# Patient Record
Sex: Female | Born: 1975 | Race: White | Hispanic: No | Marital: Married | Smoking: Current every day smoker
Health system: Southern US, Community
[De-identification: ages and names within clinical notes are randomized; demographics above are authoritative.]

## PROBLEM LIST (undated history)

## (undated) DIAGNOSIS — T4145XA Adverse effect of unspecified anesthetic, initial encounter: Secondary | ICD-10-CM

## (undated) DIAGNOSIS — Z85850 Personal history of malignant neoplasm of thyroid: Secondary | ICD-10-CM

## (undated) DIAGNOSIS — R3915 Urgency of urination: Secondary | ICD-10-CM

## (undated) DIAGNOSIS — K5900 Constipation, unspecified: Secondary | ICD-10-CM

## (undated) DIAGNOSIS — G4730 Sleep apnea, unspecified: Secondary | ICD-10-CM

## (undated) DIAGNOSIS — R918 Other nonspecific abnormal finding of lung field: Secondary | ICD-10-CM

## (undated) DIAGNOSIS — K219 Gastro-esophageal reflux disease without esophagitis: Secondary | ICD-10-CM

## (undated) DIAGNOSIS — M549 Dorsalgia, unspecified: Secondary | ICD-10-CM

## (undated) DIAGNOSIS — M542 Cervicalgia: Secondary | ICD-10-CM

## (undated) DIAGNOSIS — C801 Malignant (primary) neoplasm, unspecified: Secondary | ICD-10-CM

## (undated) DIAGNOSIS — R3989 Other symptoms and signs involving the genitourinary system: Secondary | ICD-10-CM

## (undated) DIAGNOSIS — Z923 Personal history of irradiation: Secondary | ICD-10-CM

## (undated) DIAGNOSIS — E785 Hyperlipidemia, unspecified: Secondary | ICD-10-CM

## (undated) DIAGNOSIS — M797 Fibromyalgia: Secondary | ICD-10-CM

## (undated) DIAGNOSIS — T8859XA Other complications of anesthesia, initial encounter: Secondary | ICD-10-CM

## (undated) DIAGNOSIS — R350 Frequency of micturition: Secondary | ICD-10-CM

## (undated) DIAGNOSIS — F419 Anxiety disorder, unspecified: Secondary | ICD-10-CM

## (undated) DIAGNOSIS — K589 Irritable bowel syndrome without diarrhea: Secondary | ICD-10-CM

## (undated) DIAGNOSIS — E0789 Other specified disorders of thyroid: Secondary | ICD-10-CM

## (undated) DIAGNOSIS — Z90710 Acquired absence of both cervix and uterus: Secondary | ICD-10-CM

## (undated) DIAGNOSIS — E890 Postprocedural hypothyroidism: Secondary | ICD-10-CM

## (undated) DIAGNOSIS — K921 Melena: Secondary | ICD-10-CM

## (undated) DIAGNOSIS — G43909 Migraine, unspecified, not intractable, without status migrainosus: Secondary | ICD-10-CM

## (undated) DIAGNOSIS — E669 Obesity, unspecified: Secondary | ICD-10-CM

## (undated) HISTORY — PX: OTHER SURGICAL HISTORY: SHX169

## (undated) HISTORY — PX: COLONOSCOPY: SHX174

## (undated) HISTORY — PX: TUBAL LIGATION: SHX77

## (undated) HISTORY — DX: Other specified disorders of thyroid: E07.89

## (undated) HISTORY — DX: Constipation, unspecified: K59.00

## (undated) HISTORY — DX: Irritable bowel syndrome, unspecified: K58.9

## (undated) HISTORY — DX: Dorsalgia, unspecified: M54.9

## (undated) HISTORY — DX: Obesity, unspecified: E66.9

## (undated) HISTORY — DX: Migraine, unspecified, not intractable, without status migrainosus: G43.909

## (undated) HISTORY — DX: Cervicalgia: M54.2

## (undated) HISTORY — PX: FOOT SURGERY: SHX648

---

## 1998-08-01 ENCOUNTER — Other Ambulatory Visit: Admission: RE | Admit: 1998-08-01 | Discharge: 1998-08-01 | Payer: Self-pay | Admitting: Obstetrics and Gynecology

## 1999-02-01 ENCOUNTER — Other Ambulatory Visit: Admission: RE | Admit: 1999-02-01 | Discharge: 1999-02-01 | Payer: Self-pay | Admitting: Obstetrics and Gynecology

## 1999-02-01 ENCOUNTER — Encounter (INDEPENDENT_AMBULATORY_CARE_PROVIDER_SITE_OTHER): Payer: Self-pay

## 1999-06-15 ENCOUNTER — Other Ambulatory Visit: Admission: RE | Admit: 1999-06-15 | Discharge: 1999-06-15 | Payer: Self-pay | Admitting: Emergency Medicine

## 2000-02-07 ENCOUNTER — Other Ambulatory Visit: Admission: RE | Admit: 2000-02-07 | Discharge: 2000-02-07 | Payer: Self-pay | Admitting: *Deleted

## 2000-11-06 ENCOUNTER — Other Ambulatory Visit: Admission: RE | Admit: 2000-11-06 | Discharge: 2000-11-06 | Payer: Self-pay | Admitting: *Deleted

## 2001-08-03 ENCOUNTER — Other Ambulatory Visit: Admission: RE | Admit: 2001-08-03 | Discharge: 2001-08-03 | Payer: Self-pay | Admitting: Obstetrics and Gynecology

## 2001-10-26 ENCOUNTER — Other Ambulatory Visit: Admission: RE | Admit: 2001-10-26 | Discharge: 2001-10-26 | Payer: Self-pay | Admitting: Obstetrics and Gynecology

## 2002-12-02 ENCOUNTER — Other Ambulatory Visit: Admission: RE | Admit: 2002-12-02 | Discharge: 2002-12-02 | Payer: Self-pay | Admitting: Obstetrics and Gynecology

## 2005-07-29 ENCOUNTER — Inpatient Hospital Stay (HOSPITAL_COMMUNITY): Admission: AD | Admit: 2005-07-29 | Discharge: 2005-07-29 | Payer: Self-pay | Admitting: Obstetrics and Gynecology

## 2005-08-09 ENCOUNTER — Inpatient Hospital Stay (HOSPITAL_COMMUNITY): Admission: RE | Admit: 2005-08-09 | Discharge: 2005-08-12 | Payer: Self-pay | Admitting: Obstetrics and Gynecology

## 2007-03-09 ENCOUNTER — Encounter: Admission: RE | Admit: 2007-03-09 | Discharge: 2007-03-09 | Payer: Self-pay | Admitting: Obstetrics & Gynecology

## 2007-03-09 ENCOUNTER — Ambulatory Visit: Payer: Self-pay | Admitting: Obstetrics & Gynecology

## 2007-03-10 ENCOUNTER — Ambulatory Visit: Payer: Self-pay | Admitting: Obstetrics and Gynecology

## 2007-03-17 ENCOUNTER — Ambulatory Visit: Payer: Self-pay | Admitting: Physician Assistant

## 2007-03-17 ENCOUNTER — Encounter: Payer: Self-pay | Admitting: Physician Assistant

## 2007-04-21 ENCOUNTER — Ambulatory Visit: Payer: Self-pay | Admitting: Obstetrics and Gynecology

## 2007-04-22 ENCOUNTER — Ambulatory Visit: Payer: Self-pay | Admitting: Obstetrics & Gynecology

## 2007-10-21 ENCOUNTER — Inpatient Hospital Stay (HOSPITAL_COMMUNITY): Admission: AD | Admit: 2007-10-21 | Discharge: 2007-10-24 | Payer: Self-pay | Admitting: Obstetrics and Gynecology

## 2007-10-21 ENCOUNTER — Encounter (INDEPENDENT_AMBULATORY_CARE_PROVIDER_SITE_OTHER): Payer: Self-pay | Admitting: Obstetrics and Gynecology

## 2008-03-02 ENCOUNTER — Encounter (INDEPENDENT_AMBULATORY_CARE_PROVIDER_SITE_OTHER): Payer: Self-pay | Admitting: *Deleted

## 2008-03-02 ENCOUNTER — Ambulatory Visit (HOSPITAL_COMMUNITY): Admission: RE | Admit: 2008-03-02 | Discharge: 2008-03-02 | Payer: Self-pay | Admitting: *Deleted

## 2008-03-02 HISTORY — PX: HEMORRHOID SURGERY: SHX153

## 2010-04-06 ENCOUNTER — Encounter: Payer: Self-pay | Admitting: Emergency Medicine

## 2010-04-11 ENCOUNTER — Ambulatory Visit: Payer: Self-pay | Admitting: Emergency Medicine

## 2010-04-11 DIAGNOSIS — J984 Other disorders of lung: Secondary | ICD-10-CM | POA: Insufficient documentation

## 2010-04-11 DIAGNOSIS — G43909 Migraine, unspecified, not intractable, without status migrainosus: Secondary | ICD-10-CM | POA: Insufficient documentation

## 2010-04-11 DIAGNOSIS — F17200 Nicotine dependence, unspecified, uncomplicated: Secondary | ICD-10-CM | POA: Insufficient documentation

## 2010-04-12 ENCOUNTER — Ambulatory Visit: Payer: Self-pay | Admitting: Cardiology

## 2010-04-15 HISTORY — PX: OTHER SURGICAL HISTORY: SHX169

## 2010-04-17 ENCOUNTER — Telehealth (INDEPENDENT_AMBULATORY_CARE_PROVIDER_SITE_OTHER): Payer: Self-pay | Admitting: *Deleted

## 2010-05-01 ENCOUNTER — Telehealth: Payer: Self-pay | Admitting: Emergency Medicine

## 2010-05-03 ENCOUNTER — Telehealth (INDEPENDENT_AMBULATORY_CARE_PROVIDER_SITE_OTHER): Payer: Self-pay | Admitting: *Deleted

## 2010-05-08 ENCOUNTER — Ambulatory Visit
Admission: RE | Admit: 2010-05-08 | Discharge: 2010-05-08 | Payer: Self-pay | Source: Home / Self Care | Attending: Emergency Medicine | Admitting: Emergency Medicine

## 2010-05-08 ENCOUNTER — Other Ambulatory Visit: Payer: Self-pay | Admitting: Emergency Medicine

## 2010-05-08 DIAGNOSIS — R911 Solitary pulmonary nodule: Secondary | ICD-10-CM

## 2010-05-17 NOTE — Progress Notes (Signed)
Summary: appt set op review CT  Phone Note Call from Patient Call back at Home Phone [phone]   Caller: Patient Call For: Cathi Hazan Summary of Call: Pt states she has just been diagnosed with thyroid cancer and wants to know if she should come in sooner than 6 months for a repeat ct for the nodules on her right lung since she has cancer in her body pls advise. Initial call taken by: Darletta Moll,  May 01, 2010 9:47 AM  Follow-up for Phone Call        Per last phone note pt was given results of CT but advised to set up appt to discuss next step. Pt now states she was diagnosed with thyroid cancer and wanted to know if this changed the plan for repeat CT in 6 months. I advised pt that she needs to set an appt to discuss this with Dr. Delton Coombes. Appt set for 05-15-10. Carron Curie CMA  May 01, 2010 12:14 PM

## 2010-05-17 NOTE — Progress Notes (Signed)
Summary: RESULTS OF CT  Phone Note Call from Patient Call back at Home Phone [phone]   Caller: Patient Call For: BYRUM Summary of Call: WANTS RESULTS OF CT SCAN Initial call taken by: Lacinda Axon,  April 17, 2010 3:16 PM  Follow-up for Phone Call        Called and spoke with pt and notified that RB is out of the office until 04/23/09 and advised that we will forward him this msg and he will address when he returns.  Pt verbalized understanding of this. Pls advise CT Chest results, thanks! Follow-up by: Vernie Murders,  April 17, 2010 4:58 PM  Additional Follow-up for Phone Call Additional follow up Details #1::        Please notify pt that her CT chest shows her small pulmonary nodules that were first detected on her CT abd/pelvis. There are 4 small nodules on the R, 1 small nodule on the L. We will need to repeat a CT scan of the chest without contrast in 6 months to insure that these are not changing. We will need to follow up after the CT scan to review the results and to decide on next steps.  For now I'd like her to set up an OV next available to discuss tobacco cessation and getting PFT's. Leslye Peer MD  April 24, 2010 9:06 PM  Additional Follow-up by: Leslye Peer MD,  April 24, 2010 9:06 PM    Additional Follow-up for Phone Call Additional follow up Details #2::    Spoke with pt and notified of the above results/recs per RB.  Pt verbalized understanding.  She states that this is not a good time for her to sched appt, and will call back to sched appt when she is able. Follow-up by: Vernie Murders,  April 25, 2010 8:59 AM

## 2010-05-17 NOTE — Progress Notes (Signed)
Summary: thyroid surgery 1/31 needs to be seen prior/ pt returned call  Phone Note Call from Patient   Caller: Patient Call For: DR [facility] Summary of Call: Patient phoned she has thyroid cancer and is scheduled for Thyroid surgery on 05/15/10 with Dr. Moshe Cipro. Per patient Dr. Moshe Cipro wants pt to be seen by Dr. Delton Coombes prior to the surgery because she has nodules in her lungs and he wants to know if Dr. Delton Coombes wants to biopsy prior to his surgery. Patient has canceled her appointment for 05/15/10 due to surgery that day and wants to be seen sooner.  Patient can be reached [phone] Initial call taken by: Vedia Coffer,  May 03, 2010 10:34 AM  Follow-up for Phone Call        [facility]  Can schedule pt for Tues., 1/24 @ 9am w/ RB.Michel Bickers [facility]  May 03, 2010 11:47 AM patient returned call from triage she can be reached at [phone]. Vedia Coffer  May 03, 2010 11:52 AM  Pt returned call from triage. Tivis Ringer, CNA  May 03, 2010 1:47 PM  Additional Follow-up for Phone Call Additional follow up Details #1::        Spoke with pt and this date is fine for her so sched appt with RB for Tues, 05/08/10 at 9 am. Additional Follow-up by: Vernie Murders,  May 03, 2010 2:01 PM

## 2010-05-17 NOTE — Assessment & Plan Note (Signed)
Summary: pulm nodules   Visit Type:  Follow-up Copy to:  Dr. Candie Echevaria Primary Provider/Referring Provider:  Dr. Candie Echevaria  CC:  Pulmonary nodule follow-up...new diagnosis of thyroid cancer, followed by Dr. Georjean Mode (ENT), and endocrinologist is Dr. Warden Fillers.  History of Present Illness: 35 yo woman, smoker w hx migraines. Referred for abnormal CT scan of abd and pelvis at [facility] 04/06/10 -- showed 3 small (<46mm) RLL nodules. She hasn't had CT chest yet. No other complaints except as below, "smoker's cough", some exertional dyspnea.   ROV 05/07/10 -- follow up today for small B LL nodules seen on abd CT, now confirmed on chest CT scan performed on 04/12/10. 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 Planning for surgery next week      Preventive Screening-Counseling & Management  Alcohol-Tobacco     Smoking Status: current     Packs/Day: 0.5  Current Medications (verified): 1)  Cyclobenzaprine Hcl 10 Mg Tabs (Cyclobenzaprine Hcl) .Marland Kitchen.. 1-2 By Mouth Daily As Needed 2)  Advil 200 Mg Tabs (Ibuprofen) .... As Needed 3)  Chantix 1 Mg Tabs (Varenicline Tartrate) .Marland Kitchen.. 1 By Mouth Two Times A Day 4)  Lipitor 20 Mg Tabs (Atorvastatin Calcium) .Marland Kitchen.. 1 By Mouth Daily 5)  Ambien 10 Mg Tabs (Zolpidem Tartrate) .Marland Kitchen.. 1 By Mouth At Bedtime As Needed 6)  Tramadol Hcl 50 Mg Tabs (Tramadol Hcl) .Marland Kitchen.. 1 By Mouth Rpn  Allergies (verified): 1)  ! Pcn 2)  ! Codeine  Social History: Packs/Day:  0.5  Vital Signs:  Patient profile:   35 year old female Height:      63 inches (160.02 cm) Weight:      219 pounds (99.55 kg) BMI:     38.93 O2 Sat:      96 % on Room air Temp:     98.8 degrees F (37.11 degrees C) oral Pulse rate:   73 / minute BP sitting:   112 / 80  (left arm) Cuff size:   large  Vitals Entered By: Michel Bickers CMA (May 08, 2010 8:54 AM)  O2 Sat at Rest %:  96 O2 Flow:  Room air CC: Pulmonary nodule follow-up...new diagnosis of thyroid cancer,  followed by Dr. Georjean Mode (ENT), endocrinologist is Dr. Warden Fillers Is Patient Diabetic? No Comments Medications reviewed with patient Michel Bickers CMA  May 08, 2010 9:02 AM   Physical Exam  General:  well developed, well nourished, in no acute distress, obese.   Head:  normocephalic and atraumatic Eyes:  conjunctiva and sclera clear Nose:  no deformity, discharge, inflammation, or lesions Mouth:  no deformity or lesions Lungs:  clear bilaterally to auscultation and percussion Heart:  regular rate and rhythm, S1, S2 without murmurs, rubs, gallops, or clicks Abdomen:  not examined Msk:  no deformity or scoliosis noted with normal posture Extremities:  no clubbing, cyanosis, edema, or deformity noted Neurologic:  non-focal Skin:  intact without lesions or rashes Psych:  alert and cooperative; normal mood and affect; normal attention span and concentration   Impression & Recommendations:  Problem # 1:  PULMONARY NODULE (ICD-518.89) Small nodules, not amenable to bx based on location (ast least at this time)  - follow Ct scan 3 mo - discuss Ct at [facility] to get other opinions about options.   Radiology Referral (Radiology) Est. Patient Level IV (16109)  Medications Added to Medication List This Visit: 1)  Chantix 1 Mg Tabs (Varenicline tartrate) .Marland Kitchen.. 1 by mouth two times  a day 2)  Lipitor 20 Mg Tabs (Atorvastatin calcium) .Marland Kitchen.. 1 by mouth daily 3)  Ambien 10 Mg Tabs (Zolpidem tartrate) .Marland Kitchen.. 1 by mouth at bedtime as needed 4)  Tramadol Hcl 50 Mg Tabs (Tramadol hcl) .Marland Kitchen.. 1 by mouth rpn  Patient Instructions: 1)  We will review your CT scan at Thoracic conference 2)  We will plan to repeat your CT scan with contrast in March 2012 to see if there has been any change in your small pulmonary nodules.  3)  Get your thyroid procedure as planned 4)  Follow up with Dr Delton Coombes in late March after your CT scan.    Immunization History:  Influenza Immunization History:    Influenza:   historical (04/12/2010)

## 2010-05-17 NOTE — Assessment & Plan Note (Signed)
Summary: pulm nodules, tobacco   Visit Type:  Initial Consult Copy to:  Dr. Candie Echevaria Primary Venancio Chenier/Referring Shamiah Kahler:  Dr. Candie Echevaria  CC:  Pulmonary consult for lung nodule.  History of Present Illness: 35 yo woman, smoker w hx migraines. Referred for abnormal CT scan of abd and pelvis at [facility] 04/06/10 -- showed 3 small (<84mm) RLL nodules. She hasn't had CT chest yet. No other complaints except as below, "smoker's cough", some exertional dyspnea.    Best phone # [phone]     Preventive Screening-Counseling & Management  Alcohol-Tobacco     Smoking Status: current     Packs/Day: 1.0     Pack years: 25  Current Medications (verified): 1)  Cyclobenzaprine Hcl 10 Mg Tabs (Cyclobenzaprine Hcl) .Marland Kitchen.. 1-2 By Mouth Daily As Needed 2)  Advil 200 Mg Tabs (Ibuprofen) .... As Needed  Allergies (verified): 1)  ! Pcn 2)  ! Codeine  Past History:  Past Medical History:  TOBACCO ABUSE (ICD-305.1) MIGRAINE HEADACHE (ICD-346.90)  Past Surgical History: Hemorrhoidectomy in 2009 C-section x2, 2007 & 2009  Family History: Family History Breast Cancer---mother Family History Colon Cancer---MGM Family History Asthma---brother CABG---father  Social History: Patient is a current smoker.  married tax consultantSmoking Status:  current Packs/Day:  1.0 Pack years:  25  Review of Systems       The patient complains of shortness of breath with activity, productive cough, chest pain, acid heartburn, indigestion, abdominal pain, and headaches.  The patient denies shortness of breath at rest, non-productive cough, coughing up blood, irregular heartbeats, loss of appetite, weight change, difficulty swallowing, sore throat, tooth/dental problems, nasal congestion/difficulty breathing through nose, sneezing, itching, ear ache, anxiety, depression, hand/feet swelling, joint stiffness or pain, rash, change in color of mucus, and fever.    Vital Signs:  Patient profile:   35 year  old female Height:      63 inches (160.02 cm) Weight:      218 pounds (99.09 kg) BMI:     38.76 O2 Sat:      97 % on Room air Temp:     98.1 degrees F (36.72 degrees C) oral Pulse rate:   92 / minute BP sitting:   122 / 82  (left arm) Cuff size:   large  Vitals Entered By: Michel Bickers CMA (April 11, 2010 2:14 PM)  O2 Sat at Rest %:  97 O2 Flow:  Room air  Physical Exam  General:  well developed, well nourished, in no acute distress, obese.   Head:  normocephalic and atraumatic Eyes:  conjunctiva and sclera clear Nose:  no deformity, discharge, inflammation, or lesions Mouth:  no deformity or lesions Lungs:  clear bilaterally to auscultation and percussion Heart:  regular rate and rhythm, S1, S2 without murmurs, rubs, gallops, or clicks Abdomen:  not examined Msk:  no deformity or scoliosis noted with normal posture Extremities:  no clubbing, cyanosis, edema, or deformity noted Neurologic:  non-focal Skin:  intact without lesions or rashes Psych:  alert and cooperative; normal mood and affect; normal attention span and concentration   Impression & Recommendations:  Problem # 1:  PULMONARY NODULE (ICD-518.89) 3 small RLL nodules in a smoker, asymptomatic. Will get dedicated CT chest, make f/u plans depending on whethe we see additional nodules I will call her w results, best phone # is above Orders: Consultation Level IV (30865) Radiology Referral (Radiology)  Problem # 2:  TOBACCO ABUSE (ICD-305.1) Will need PFt, to discuss cessation in the future  Medications  Added to Medication List This Visit: 1)  Cyclobenzaprine Hcl 10 Mg Tabs (Cyclobenzaprine hcl) .Marland Kitchen.. 1-2 by mouth daily as needed 2)  Advil 200 Mg Tabs (Ibuprofen) .... As needed  Patient Instructions: 1)  We will perform a CT scan of the chest to better evaluate your pulmonary nodules.  2)  Dr Delton Coombes will call you to review the results.  3)  We will plan for follow up when we speak on the phone.

## 2010-06-29 ENCOUNTER — Emergency Department (HOSPITAL_BASED_OUTPATIENT_CLINIC_OR_DEPARTMENT_OTHER)
Admission: EM | Admit: 2010-06-29 | Discharge: 2010-06-29 | Disposition: A | Discharge status: Home / Self Care | Payer: 59 | Attending: Emergency Medicine | Admitting: Emergency Medicine

## 2010-06-29 ENCOUNTER — Emergency Department (INDEPENDENT_AMBULATORY_CARE_PROVIDER_SITE_OTHER): Payer: 59

## 2010-06-29 DIAGNOSIS — R071 Chest pain on breathing: Secondary | ICD-10-CM | POA: Insufficient documentation

## 2010-06-29 DIAGNOSIS — R079 Chest pain, unspecified: Secondary | ICD-10-CM | POA: Insufficient documentation

## 2010-06-29 DIAGNOSIS — E7800 Pure hypercholesterolemia, unspecified: Secondary | ICD-10-CM | POA: Insufficient documentation

## 2010-06-29 DIAGNOSIS — F17200 Nicotine dependence, unspecified, uncomplicated: Secondary | ICD-10-CM | POA: Insufficient documentation

## 2010-06-29 DIAGNOSIS — Z85850 Personal history of malignant neoplasm of thyroid: Secondary | ICD-10-CM

## 2010-06-29 LAB — CBC
HCT: 38.3 % (ref 36.0–46.0)
Hemoglobin: 12.8 g/dL (ref 12.0–15.0)
MCV: 81.3 fL (ref 78.0–100.0)
RBC: 4.71 MIL/uL (ref 3.87–5.11)
RDW: 14.2 % (ref 11.5–15.5)
WBC: 10 10*3/uL (ref 4.0–10.5)

## 2010-06-29 LAB — DIFFERENTIAL
Basophils Relative: 0 % (ref 0–1)
Eosinophils Absolute: 0.4 10*3/uL (ref 0.0–0.7)
Lymphocytes Relative: 36 % (ref 12–46)
Monocytes Absolute: 0.5 10*3/uL (ref 0.1–1.0)
Neutrophils Relative %: 56 % (ref 43–77)

## 2010-06-29 LAB — POCT CARDIAC MARKERS
CKMB, poc: <1 ng/mL — ABNORMAL LOW (ref 1.0–8.0)
Myoglobin, poc: 67 ng/mL (ref 12–200)
Myoglobin, poc: 58.2 ng/mL (ref 12–200)
Troponin i, poc: <0.05 ng/mL (ref 0.00–0.09)

## 2010-06-29 LAB — BASIC METABOLIC PANEL
CO2: 25 mEq/L (ref 19–32)
Calcium: 9.4 mg/dL (ref 8.4–10.5)
Chloride: 108 mEq/L (ref 96–112)
GFR calc Af Amer: >60 mL/min (ref >60)
Potassium: 4.1 mEq/L (ref 3.5–5.1)
Sodium: 145 mEq/L (ref 135–145)

## 2010-07-13 ENCOUNTER — Ambulatory Visit (INDEPENDENT_AMBULATORY_CARE_PROVIDER_SITE_OTHER)
Admission: RE | Admit: 2010-07-13 | Discharge: 2010-07-13 | Disposition: A | Discharge status: Home / Self Care | Payer: 59 | Source: Ambulatory Visit | Attending: Emergency Medicine | Admitting: Emergency Medicine

## 2010-07-13 ENCOUNTER — Telehealth: Payer: Self-pay | Admitting: Emergency Medicine

## 2010-07-13 DIAGNOSIS — R911 Solitary pulmonary nodule: Secondary | ICD-10-CM

## 2010-07-13 DIAGNOSIS — J984 Other disorders of lung: Secondary | ICD-10-CM

## 2010-07-13 MED ORDER — IOHEXOL 300 MG/ML  SOLN
80.0000 mL | Freq: Once | INTRAMUSCULAR | Status: AC | PRN
  Administered 2010-07-13: 80 mL via INTRAVENOUS

## 2010-07-13 NOTE — Telephone Encounter (Signed)
lmomtcb x1 to advise pt Dr. Delton Coombes is not in the office for the next week

## 2010-07-13 NOTE — Telephone Encounter (Signed)
lmomtcb x1 to advise of ct results

## 2010-07-13 NOTE — Telephone Encounter (Signed)
Please inform patient that I have reviewed her CT scan of the chest. Her pulmonary nodules are unchanged compared with original scan. We will need to repeat her CT scan in 6 months to insure these are not changing. Thanks.

## 2010-07-13 NOTE — Telephone Encounter (Signed)
Pt called back and advised pt Dr. Delton Coombes is out of the office for the next week but I would send the message to him advising him pt is requesting her ct results. She verbalized understanding. Dr. Delton Coombes please advise thanks

## 2010-07-16 NOTE — Telephone Encounter (Signed)
Spoke with pt and advised her of ct results. Pt verbalized understanding and had no further questions. Dr. Delton Coombes please advise if you would like ct chest w/ or w/o contrast. Thanks

## 2010-07-16 NOTE — Telephone Encounter (Signed)
Patient phoned stated that she was returning a call to Triage. She can be reached at [phone].Vedia Coffer

## 2010-07-16 NOTE — Telephone Encounter (Signed)
lmomtcb x 2  

## 2010-07-24 ENCOUNTER — Telehealth: Payer: Self-pay | Admitting: Emergency Medicine

## 2010-07-24 NOTE — Telephone Encounter (Signed)
Advised pt per last ov note it states for her to f/u in march. I advised her she should just keep the apt scheduled for Fri to just follow up w/ Dr. Delton Coombes on how things are going. Pt verbalized understanding

## 2010-07-24 NOTE — Telephone Encounter (Signed)
Pt needs CT scan of the chest without IV contrast in 6 months to follow stable pulm nodules.

## 2010-07-24 NOTE — Telephone Encounter (Signed)
Dr byrum put order into [facility] workque it was deferred to call pt in 5mos for a 49mo chest ct dr byrum is aware of this Sherri [name]

## 2010-07-26 ENCOUNTER — Encounter: Payer: Self-pay | Admitting: Emergency Medicine

## 2010-07-27 ENCOUNTER — Ambulatory Visit (INDEPENDENT_AMBULATORY_CARE_PROVIDER_SITE_OTHER): Payer: 59 | Admitting: Emergency Medicine

## 2010-07-27 ENCOUNTER — Encounter: Payer: Self-pay | Admitting: Emergency Medicine

## 2010-07-27 DIAGNOSIS — F17200 Nicotine dependence, unspecified, uncomplicated: Secondary | ICD-10-CM

## 2010-07-27 DIAGNOSIS — J984 Other disorders of lung: Secondary | ICD-10-CM

## 2010-07-27 NOTE — Progress Notes (Signed)
  Subjective:    Patient ID: Sherri [name], female    DOB: 1975-04-21, 35 y.o.   MRN: [id number]  HPI 35 yo woman, smoker w hx migraines. Referred for abnormal CT scan of abd and pelvis at [facility] 04/06/10 -- showed 3 small (<95mm) RLL nodules. She hasn't had CT chest yet. No other complaints except as below, "smoker's cough", some exertional dyspnea.   ROV 05/07/10 -- follow up today for small B LL nodules seen on abd CT, now confirmed on chest CT scan performed on 04/12/10. 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 Planning for surgery next week   ROV 07/27/10 -- hx tobacco, B small LL nodules (4-6 mm). Smoking 1 pk a day, was previously on Chantix, stopped.  CT scan 07/13/10 showed stable nodules. No new breathing sx.      CT CHEST 07/13/10:   Comparison: 04/12/2010  Findings: No enlarged axillary or supraclavicular lymph nodes.  There is no enlarged mediastinal or hilar lymph nodes.  Pulmonary nodule within the medial right lower lobe measures 0.4 cm, image 29. Stable from prior study.  Left lower lobe pulmonary nodule measures 0.5 cm, image 35 and appears unchanged from previous exam. Within the right lower lobe there is a small nodule measuring 4.9 mm, image 38.  This is stable from prior exam.  Other small nodules are stable from previous exam.  Visualized osseous structures are unremarkable.  Limited imaging through the upper abdomen shows diffuse fatty infiltration of the liver.  IMPRESSION:  1.  Stable pulmonary nodules compared with 04/12/2010.  Continued follow-up is recommended to ensure ongoing stability. Depending on the patient's risk factors for developing bronchogenic carcinoma I would see a 6 to 28-month follow-up noncontrast CT scan.   Review of Systems  Respiratory: Negative for cough, chest tightness, shortness of breath and wheezing.   Cardiovascular: Negative for chest pain.       Objective:   Physical  Exam Gen: Pleasant, well-nourished, in no distress,  normal affect  ENT: No lesions,  mouth clear,  oropharynx clear, no postnasal drip  Lungs: No use of accessory muscles, no dullness to percussion, clear without rales or rhonchi  Cardiovascular: RRR, heart sounds normal, no murmur or gallops, no peripheral edema  Musculoskeletal: No deformities, no cyanosis or clubbing  Neuro: alert, non focal  Skin: Warm, no lesions or rashes          Assessment & Plan:  PULMONARY NODULE Stable nodules on CT scan 07/13/10 - repeat scan in September 2012   TOBACCO ABUSE Discussed cessation - she is considering;.

## 2010-07-27 NOTE — Assessment & Plan Note (Signed)
Discussed cessation - she is considering;.

## 2010-07-27 NOTE — Assessment & Plan Note (Signed)
Stable nodules on CT scan 07/13/10 - repeat scan in September 2012

## 2010-07-27 NOTE — Patient Instructions (Addendum)
You will need a repeat CT scan of the chest in September 2012.  You need to stop smoking.  Follow up with Dr Delton Coombes in September after your scan is done.

## 2010-08-01 ENCOUNTER — Telehealth: Payer: Self-pay | Admitting: Emergency Medicine

## 2010-08-01 MED ORDER — VARENICLINE TARTRATE 0.5 MG X 11 & 1 MG X 42 PO MISC: ORAL | Status: AC

## 2010-08-01 NOTE — Telephone Encounter (Signed)
Per Lawson Fiscal okay to send to pharmacy. Lawson Fiscal called and left pt detailed message advising her rx was sent to pharmacy

## 2010-08-28 NOTE — Op Note (Signed)
Sherri [name], Sherri [name]             ACCOUNT NO.:  [id number]   MEDICAL RECORD NO.:  [id number]          PATIENT TYPE:  INP   LOCATION:  9131                          FACILITY:  [facility]   PHYSICIAN:  Lenoard Aden, M.D.DATE OF BIRTH:  11/12/1975   DATE OF PROCEDURE:  10/21/2007  DATE OF DISCHARGE:                               OPERATIVE REPORT   PREOPERATIVE DIAGNOSIS:  Term intrauterine pregnancy with spontaneous  rupture of membranes in active labor with history of previous C-section.   POSTOPERATIVE DIAGNOSIS:  Term intrauterine pregnancy with spontaneous  rupture of membranes in active labor with history of previous C-section.   PROCEDURE:  Repeat C-section and tubal ligation.   SURGEON:  Lenoard Aden, MD   ASSISTANT:  Marlinda Mike, CNM   ANESTHESIA:  Spinal by Dr. Pamalee Leyden.   ESTIMATED BLOOD LOSS:  1000 mL.   COMPLICATIONS:  None.   DRAINS:  Foley.   COUNTS:  Correct.   The patient to recovery in good condition.   FINDINGS:  Full-term living female, weight is 9 pounds 5 ounces, occiput  posterior position, normal tubes, and normal ovaries.  Omental adhesions  to the anterior abdominal wall.   DESCRIPTION OF PROCEDURE:  After being apprised of the risks of  anesthesia, infection, bleeding, injury to abdominal organs and need for  repair, the labor's immediate complications including bowel and bladder  injury, the patient was brought to the operating room where she was  administered spinal anesthetic without complications.  She was prepped  and draped in the usual sterile fashion.  A Foley catheter was placed.  After achieving adequate anesthesia, Marcaine solution was placed and a  skin incision was made with the scalpel and carried down to the fascia,  which was nicked in the midline and opened transversely with the Mayo  scissors.  Rectus muscle was separated sharply in the midline.  Peritoneum entered sharply.  Omental adhesions at the anterior abdominal  wall  were lysed using electrocautery without difficulty.  Good  hemostasis was noted.  Bladder blade placed.  Visceral peritoneum was  scored in a __________ fashion off the lower uterine segment.  Kerr  hysterotomy incision made.  Atraumatic delivery.  Occiput posterior  position.  A full-term living female was noted and handed over to the  pediatrician.  Apgars 8 and 9.  Cord blood was collected.  Placenta  removed manually, intact 3-vessel cord, uterus exteriorized, curetted  using a dry lap pack and closed in 1 running layer of 0 Monocryl suture.  An interrupted suture was placed in the midline for hemostasis.  Irrigation accomplished.  Bladder flap inspected and found be  hemostatic.  Right tube traced out to the fimbriated end, and ampullary-  isthmic portion of the tube identified.  Avascular portion of the  mesosalpinx was cauterized creating a window.  A 0 plain suture placed  proximally and distally and ties secured.  Tubal segment removed.  Hemostasis obtained.  Tubal lumens visualized and cauterized.  Sutures  cut.  Same procedure was done on the right tube as done on the left  tube.  Tubal segments sent  to pathology.  Irrigation accomplished.  Good  hemostasis noted.  The fascia was then closed using 0 Monocryl  __________.  Subcutaneous tissue reapproximated using 2-0 plain sutures.  Skin was closed using staples.  The patient tolerated the procedure  well.  The patient went to recovery in good condition.      Lenoard Aden, M.D.  Electronically Signed     RJT/MEDQ  D:  10/21/2007  T:  10/21/2007  Job:  [id number]

## 2010-08-28 NOTE — Op Note (Signed)
Sherri [name], Sherri [name]             ACCOUNT NO.:  [id number]   MEDICAL RECORD NO.:  [id number]          PATIENT TYPE:  AMB   LOCATION:  DAY                          FACILITY[facility]  [facility]   PHYSICIAN:  Alfonse Ras, MD   DATE OF BIRTH:  06-01-1975   DATE OF PROCEDURE:  03/02/2008  DATE OF DISCHARGE:                               OPERATIVE REPORT   PREOPERATIVE DIAGNOSIS:  Internal hemorrhoids grade 2 and questionable  right buttock mass.   POSTOPERATIVE DIAGNOSIS:  Grade 2 internal hemorrhoids with anal skin  tag.  No evidence of right buttock mass.   PROCEDURE:  PPH hemorrhoidectomy and excision of anal skin tag.   ANESTHESIA:  General.   ASSISTANT:  Bertram Savin   DESCRIPTION:  The patient was taken to the operating room after informed  consent was granted  __________ underwent general anesthesia by  endotracheal tube.  She was placed in the prone jack-knife position.  The perianal and rectal prep were undertaken.  The vagina was also  prepped.  Anal dilatation was accomplished with 3 fingerbreadths.  Internal hemorrhoidal bundles were injected with 0.5 Marcaine with  Wydase.  The internal and external sphincter muscles were injected with  0.25 Marcaine circumferentially.   The 2-0 Prolene pursestring suture was placed in mucosa and submucosa,  approximately 5 cm was proximal to the dentate line.  PTH stapler was  then placed and the pursestring suture was tied down along the endo.  The stapler was closed.  The vagina was checked.  It was held in place  for 1 minute and then fired.  It was released and removed.  There was a  good 1.5 to 2 cm doughnut that was removed.  Staple line was inspected.  There was no bleeding.  Gelfoam packing was placed.  A small right  anterior anal skin tag was excised with Bovie electrocautery right at  the anoderm.  Dressing was applied.  The patient tolerated the procedure  well went to PACU in good condition.      Alfonse Ras, MD  Electronically Signed     KRE/MEDQ  D:  03/02/2008  T:  03/03/2008  Job:  [id number]

## 2010-08-31 NOTE — Op Note (Signed)
NAMEANASTAISA, Sherri [name]             ACCOUNT NO.:  [id number]   MEDICAL RECORD NO.:  [id number]          PATIENT TYPE:  MAT   LOCATION:  MATC                          FACILITY:  [facility]   PHYSICIAN:  Lenoard Aden, M.D.DATE OF BIRTH:  10-15-1975   DATE OF PROCEDURE:  07/29/2005  DATE OF DISCHARGE:                                 OPERATIVE REPORT   DESCRIPTION OF PROCEDURE:  After being apprised of the risks and benefits of  external cephalic version, __________  small incidence of fetal bradycardia  with placental abruption and possible need for urgent cesarean section, the  patient was brought to the procedure room where a reactive NST was obtained.  The patient was given subcu terbutaline and Stadol IV and the fetus was  reactive.  No regular contractions were noted.  Fetal confirmation of frank  breech presentation of the head in right upper maternal quadrant was noted.  A backward roll was attempted x1 without success.  Forward rolls attempted  x2 with movement to the oblique position but not convergent to the vertex  position at this time.  Fetal vertex has moved back into the right upper  quadrant.  Fetal heart tones noted to be in the 120-140 beats per minute  range through the entire procedure. __________  cephalic version is noted.  The patient tolerates the procedure well and will be discharged to home  after reactive NST is noted.      Lenoard Aden, M.D.  Electronically Signed     RJT/MEDQ  D:  07/29/2005  T:  07/30/2005  Job:  [id number]

## 2010-08-31 NOTE — Discharge Summary (Signed)
Sherri [name], Sherri [name]             ACCOUNT NO.:  [id number]   MEDICAL RECORD NO.:  [id number]          PATIENT TYPE:  INP   LOCATION:  9107                          FACILITY:  [facility]   PHYSICIAN:  Lenoard Aden, M.D.DATE OF BIRTH:  June 30, 1975   DATE OF ADMISSION:  08/09/2005  DATE OF DISCHARGE:  08/12/2005                                 DISCHARGE SUMMARY   HOSPITAL COURSE:  Underwent uncomplicated primary C-section for breech on  August 09, 2005, postoperative course uncomplicated, discharge teaching done.  On postoperative day number two, tolerated a regular diet well.  Prenatal  vitamins, iron, Tylox given on postoperative day number three.  Follow up in  the office in 4-6 weeks.      Lenoard Aden, M.D.  Electronically Signed     RJT/MEDQ  D:  09/15/2005  T:  09/16/2005  Job:  [id number]

## 2010-08-31 NOTE — Op Note (Signed)
Sherri [name], Sherri [name]             ACCOUNT NO.:  [id number]   MEDICAL RECORD NO.:  [id number]          PATIENT TYPE:  INP   LOCATION:  9107                          [facility]ACILITY:  [facility]   PHYSICIAN:  Lenoard Aden, M.D.DATE OF BIRTH:  November 25, 1975   DATE OF PROCEDURE:  08/09/2005  DATE OF DISCHARGE:                                 OPERATIVE REPORT   PREOPERATIVE DIAGNOSIS:  A 39-week intrauterine pregnancy, breech  presentation, failed external cephalic version previously.   POSTOPERATIVE DIAGNOSIS:  A 39-week intrauterine pregnancy, breech  presentation, failed external cephalic version previously.   OPERATION PERFORMED:  Primary low segment transverse cesarean section.   SURGEON:  Lenoard Aden, M.D.   ASSISTANT:  Marlinda Mike, C.N.M.   ANESTHESIA:  Spinal by Quillian Quince, M.D.   ESTIMATED BLOOD LOSS:  1000 mL.   COMPLICATIONS:  None.   DRAINS:  Foley.   COUNTS:  Correct.   Patient to recovery in good condition.   FINDINGS:  Full term living female, footling breach position.  Apgars 9 and  9.  Pediatricians in attendance.  Three-vessel cord noted.   DESCRIPTION OF PROCEDURE:  After being apprised of the risks of anesthesia,  infection, bleeding, injury to abdominal organs and need for repair.  Patient was brought to the operating room where she was administered spinal  anesthetic without complications, prepped and draped in the usual sterile  fashion.  Foley catheter placed.  Pfannenstiel skin incision made with a  scalpel and carried down to the fascia which was nicked in the midline and  opened transversely using Mayo scissors.  Rectus muscles dissected sharply  in the midline.  Peritoneum entered sharply.  Bladder blade placed.  Visceral peritoneum was scored sharply off the lower uterine segment.  Curved hysterotomy incision made and extended bluntly.  Atraumatic delivery  from footling breech position using the usual maneuvers for breech delivery  and  flexion of fetal vertex are done without difficulty.  Bulb suction  performed.  Fetus was handed to pediatricians who were in attendance.  Apgars of 8 and 9.  Cord blood  was collected.  Placenta delivered manually  intact from a posterior location. Uterus exteriorized, curetted using a dry  lap pack and closed in two layers using a 0 Monocryl running suture, bladder  flap inspected and found to be hemostatic.  Irrigation  accomplished.  Fascia closed using 0 Monocryl stitch in running fashion.  Subcutaneous tissue closed using 0 plain continuous running suture.  Skin  closed using staples.  The patient tolerated the procedure well, is  transferred to recovery in good condition.      Lenoard Aden, M.D.  Electronically Signed     RJT/MEDQ  D:  08/09/2005  T:  08/10/2005  Job:  [id number]

## 2010-08-31 NOTE — Discharge Summary (Signed)
NAMEDIAHN, WAIDELICH             ACCOUNT NO.:  [id number]   MEDICAL RECORD NO.:  [id number]          PATIENT TYPE:  INP   LOCATION:  9131                          [facility]ILITY:  [facility]   PHYSICIAN:  Lenoard Aden, M.D.DATE OF BIRTH:  1975-04-26   DATE OF ADMISSION:  10/21/2007  DATE OF DISCHARGE:  10/24/2007                               DISCHARGE SUMMARY   REASON FOR ADMISSION:  Term intrauterine pregnancy, previous C-sections,  and spontaneous rupture of membranes.   PERTINENT FINDINGS:  Normal physical exam.   PERTINENT LAB DATA:  Normal CBC.   HOSPITAL COURSE:  The patient underwent uncomplicated repeat C-section,  tubal ligation.  No complications.  Postoperative course uncomplicated -  discharged to home on day 4 without complication.   FINAL DISCHARGE DIAGNOSES:  Term intrauterine pregnancy, previous C-  sections, and spontaneous rupture of membranes.   CONDITION ON DISCHARGE:  Good.   COMPLICATIONS:  None.   FOLLOWUP:  Followup in the office in 6 weeks.      Lenoard Aden, M.D.  Electronically Signed     RJT/MEDQ  D:  11/19/2007  T:  11/20/2007  Job:  [id number]

## 2010-10-25 ENCOUNTER — Encounter (HOSPITAL_COMMUNITY): Payer: Self-pay | Admitting: *Deleted

## 2010-11-07 ENCOUNTER — Other Ambulatory Visit: Payer: Self-pay | Admitting: Obstetrics and Gynecology

## 2010-11-07 ENCOUNTER — Encounter (HOSPITAL_COMMUNITY)
Admission: RE | Disposition: A | Discharge status: Home / Self Care | Payer: Self-pay | Source: Ambulatory Visit | Attending: Obstetrics and Gynecology

## 2010-11-07 ENCOUNTER — Encounter (HOSPITAL_COMMUNITY): Payer: Self-pay | Admitting: Anesthesiology

## 2010-11-07 ENCOUNTER — Ambulatory Visit (HOSPITAL_COMMUNITY)
Admission: RE | Admit: 2010-11-07 | Discharge: 2010-11-07 | Disposition: A | Discharge status: Home / Self Care | Payer: 59 | Source: Ambulatory Visit | Attending: Obstetrics and Gynecology | Admitting: Obstetrics and Gynecology

## 2010-11-07 ENCOUNTER — Ambulatory Visit (HOSPITAL_COMMUNITY): Payer: 59 | Admitting: Anesthesiology

## 2010-11-07 DIAGNOSIS — G43909 Migraine, unspecified, not intractable, without status migrainosus: Secondary | ICD-10-CM

## 2010-11-07 DIAGNOSIS — N840 Polyp of corpus uteri: Secondary | ICD-10-CM | POA: Insufficient documentation

## 2010-11-07 DIAGNOSIS — J984 Other disorders of lung: Secondary | ICD-10-CM

## 2010-11-07 DIAGNOSIS — N920 Excessive and frequent menstruation with regular cycle: Secondary | ICD-10-CM | POA: Insufficient documentation

## 2010-11-07 DIAGNOSIS — F17200 Nicotine dependence, unspecified, uncomplicated: Secondary | ICD-10-CM

## 2010-11-07 HISTORY — DX: Malignant (primary) neoplasm, unspecified: C80.1

## 2010-11-07 HISTORY — PX: OTHER SURGICAL HISTORY: SHX169

## 2010-11-07 LAB — CBC
MCH: 27.4 pg (ref 26.0–34.0)
MCHC: 33.2 g/dL (ref 30.0–36.0)
MCV: 82.4 fL (ref 78.0–100.0)
Platelets: 270 10*3/uL (ref 150–400)
RBC: 4.82 MIL/uL (ref 3.87–5.11)

## 2010-11-07 LAB — DIFFERENTIAL
Basophils Relative: 0 % (ref 0–1)
Eosinophils Relative: 2 % (ref 0–5)
Lymphs Abs: 3.3 10*3/uL (ref 0.7–4.0)
Monocytes Absolute: 0.7 10*3/uL (ref 0.1–1.0)

## 2010-11-07 SURGERY — DILATATION & CURETTAGE/HYSTEROSCOPY WITH NOVASURE ABLATION
Anesthesia: Monitor Anesthesia Care | Site: Uterus | Wound class: Clean Contaminated

## 2010-11-07 MED ORDER — SCOPOLAMINE 1 MG/3DAYS TD PT72: 1.0000 | MEDICATED_PATCH | Freq: Once | TRANSDERMAL | Status: DC | PRN

## 2010-11-07 MED ORDER — CITRIC ACID-SODIUM CITRATE 334-500 MG/5ML PO SOLN: 30.0000 mL | Freq: Once | ORAL | Status: DC | PRN

## 2010-11-07 MED ORDER — LACTATED RINGERS IR SOLN
Status: DC | PRN
  Administered 2010-11-07: 3000 mL

## 2010-11-07 MED ORDER — LIDOCAINE HCL (CARDIAC) 20 MG/ML IV SOLN
INTRAVENOUS | Status: DC | PRN
  Administered 2010-11-07: 100 mg via INTRAVENOUS

## 2010-11-07 MED ORDER — BUPIVACAINE HCL (PF) 0.25 % IJ SOLN
INTRAMUSCULAR | Status: DC | PRN
  Administered 2010-11-07: 20 mL

## 2010-11-07 MED ORDER — KETOROLAC TROMETHAMINE 30 MG/ML IJ SOLN: 15.0000 mg | Freq: Once | INTRAMUSCULAR | Status: DC | PRN

## 2010-11-07 MED ORDER — KETOROLAC TROMETHAMINE 60 MG/2ML IM SOLN
INTRAMUSCULAR | Status: DC | PRN
  Administered 2010-11-07: 30 mg via INTRAMUSCULAR

## 2010-11-07 MED ORDER — PANTOPRAZOLE SODIUM 40 MG PO TBEC: 40.0000 mg | DELAYED_RELEASE_TABLET | Freq: Once | ORAL | Status: DC | PRN

## 2010-11-07 MED ORDER — DEXAMETHASONE SODIUM PHOSPHATE 10 MG/ML IJ SOLN
INTRAMUSCULAR | Status: DC | PRN
  Administered 2010-11-07: 10 mg via INTRAVENOUS

## 2010-11-07 MED ORDER — FAMOTIDINE 20 MG PO TABS: 20.0000 mg | ORAL_TABLET | Freq: Once | ORAL | Status: DC | PRN

## 2010-11-07 MED ORDER — PROPOFOL 10 MG/ML IV EMUL
INTRAVENOUS | Status: DC | PRN
  Administered 2010-11-07: 200 mg via INTRAVENOUS

## 2010-11-07 MED ORDER — FENTANYL CITRATE 0.05 MG/ML IJ SOLN
INTRAMUSCULAR | Status: DC | PRN
  Administered 2010-11-07: 100 ug via INTRAVENOUS
  Administered 2010-11-07 (×3): 50 ug via INTRAVENOUS

## 2010-11-07 MED ORDER — LACTATED RINGERS IV SOLN
INTRAVENOUS | Status: DC
  Administered 2010-11-07 (×2): via INTRAVENOUS

## 2010-11-07 MED ORDER — ONDANSETRON HCL 4 MG/2ML IJ SOLN
INTRAMUSCULAR | Status: DC | PRN
  Administered 2010-11-07: 4 mg via INTRAVENOUS

## 2010-11-07 MED ORDER — KETOROLAC TROMETHAMINE 30 MG/ML IJ SOLN
INTRAMUSCULAR | Status: DC | PRN
  Administered 2010-11-07: 30 mg via INTRAVENOUS

## 2010-11-07 MED ORDER — FENTANYL CITRATE 0.05 MG/ML IJ SOLN: 25.0000 ug | INTRAMUSCULAR | Status: DC | PRN

## 2010-11-07 MED ORDER — MIDAZOLAM HCL 5 MG/5ML IJ SOLN
INTRAMUSCULAR | Status: DC | PRN
  Administered 2010-11-07: 2 mg via INTRAVENOUS

## 2010-11-07 SURGICAL SUPPLY — 14 items
ABLATOR ENDOMETRIAL BIPOLAR (ABLATOR) ×2 IMPLANT
CATH ROBINSON RED A/P 16FR (CATHETERS) ×2 IMPLANT
CLOTH BEACON ORANGE TIMEOUT ST (SAFETY) ×2 IMPLANT
CONTAINER PREFILL 10% NBF 60ML (FORM) ×4 IMPLANT
GLOVE BIO SURGEON STRL SZ7.5 (GLOVE) ×4 IMPLANT
GOWN PREVENTION PLUS LG XLONG (DISPOSABLE) ×2 IMPLANT
GOWN PREVENTION PLUS XLARGE (GOWN DISPOSABLE) ×2 IMPLANT
NDL SPNL 22GX3.5 QUINCKE BK (NEEDLE) ×1 IMPLANT
NEEDLE SPNL 22GX3.5 QUINCKE BK (NEEDLE) ×2 IMPLANT
PACK HYSTEROSCOPY LF (CUSTOM PROCEDURE TRAY) ×2 IMPLANT
PAD PREP 24X48 CUFFED NSTRL (MISCELLANEOUS) ×2 IMPLANT
SYR TB 1ML 25GX5/8 (SYRINGE) ×2 IMPLANT
TOWEL OR 17X24 6PK STRL BLUE (TOWEL DISPOSABLE) ×4 IMPLANT
WATER STERILE IRR 1000ML POUR (IV SOLUTION) ×2 IMPLANT

## 2010-11-07 NOTE — Transfer of Care (Signed)
Immediate Anesthesia Transfer of Care Note  Patient: Sherri [name]  Procedure(s) Performed:  DILATATION & CURETTAGE/HYSTEROSCOPY WITH NOVASURE ABLATION  Patient Location: PACU  Anesthesia Type: General  Level of Consciousness: awake, alert , oriented, patient cooperative and responds to stimulation  Airway & Oxygen Therapy: Patient Spontanous Breathing and Patient connected to nasal cannula oxygen  Post-op Assessment: Report given to PACU RN, Post -op Vital signs reviewed and stable and Patient moving all extremities  Post vital signs: Reviewed and stable  Complications: No apparent anesthesia complications

## 2010-11-07 NOTE — H&P (Signed)
Sherri [name], Sherri [name]             ACCOUNT NO.:  [id number]  MEDICAL RECORD NO.:  [id number]  LOCATION:                                 FACILITY:  PHYSICIAN:  Lenoard Aden, M.D.DATE OF BIRTH:  1975/05/16  DATE OF ADMISSION: DATE OF DISCHARGE:                             HISTORY & PHYSICAL   CHIEF COMPLAINT:  Refractory menorrhagia.  HISTORY OF PRESENT ILLNESS:  A 35 year old white female G2, P3 with a history of C-section and tubal ligation, who presents with refractory menorrhagia, normal lab workup, and normal ultrasound.  She presents with desire for endometrial ablation.  MEDICATIONS:  Include Diflucan, thyroid replacement therapy, cholesterol medication, lorazepam as needed, topiramate, indomethacin, Maxalt, Ambien, and gastroesophageal reflux agent.  ALLERGIES:  She has allergies to VICODIN and a LATEX allergy.  FAMILY HISTORY:  Stroke, thyroid dysfunction, pneumonia, rheumatoid arthritis, heart disease, alcohol abuse and depression.  She has a history of 2 previous C-sections with tubal ligation noted, history of thyroidectomy with paratracheal node dissection.  PHYSICAL EXAMINATION:  GENERAL:  She is a well-developed, well-nourished white female, in no acute distress. HEENT:  Normal. NECK:  Supple.  Full range of motion. LUNGS:  Clear. HEART:  Regular __________. ABDOMEN:  Soft, nontender. EXTREMITIES:  There were no cords. NEUROLOGIC:  Nonfocal. SKIN:  Intact.  Pelvic CT scan reveals a bulky uterus with no adnexal masses.  IMPRESSION:  Refractory menorrhagia with normal ultrasound and normal labs and status post tubal ligation, for definitive therapy.  PLAN:  To continue with diagnostic hysteroscopy, possible resectoscope with D and C, NovaSure endometrial ablation.  Risks of anesthesia, infection, bleeding, injury to intraabdominal organs and need for repair discussed.  Delayed versus immediate complications include bowel and bladder injury noted.   The patient acknowledges and wishes to proceed.     Lenoard Aden, M.D.     RJT/MEDQ  D:  11/06/2010  T:  11/06/2010  Job:  [id number]

## 2010-11-07 NOTE — Op Note (Signed)
NAMEDIA, DONATE             ACCOUNT NO.:  [id number]  MEDICAL RECORD NO.:  [id number]  LOCATION:  [facility]                          [facility]ILITY[facility]  [facility]  PHYSICIAN:  Lenoard Aden, M.D.DATE OF BIRTH:  01/06/1976  DATE OF PROCEDURE: DATE OF DISCHARGE:  11/07/2010                              OPERATIVE REPORT   PREOPERATIVE DIAGNOSIS:  Refractory menometrorrhagia.  POSTOPERATIVE DIAGNOSIS:  Refractory menometrorrhagia.  PROCEDURE DONE:  Endometrial polyp, diagnostic hysteroscopy, D and C, NovaSure endometrial ablation, endometrial polyp removal.  SURGEON:  Lenoard Aden, MD.  ASSISTANT:  None.  ANESTHESIA:  General.  ESTIMATED BLOOD LOSS:  Less than 50 mL.  FLUID DEFICIT:  100 mL.  COMPLICATIONS:  None.  DRAINS:  None.  COUNTS:  Correct.  CONDITION:  The patient went to recovery in good condition.  FINDINGS:  Thickened posterior wall at the fundus with a small endometrial polyp removed and sent to pathology.  The patient went to recovery in good condition.  BRIEF OPERATIVE NOTE:  After being apprised of risks of anesthesia, infection, bleeding, injury to abdominal organs, need for repair, delayed versus immediate complications to include bowel and bladder injury, possible need for repair, the patient was brought to the operating room where she was administered general anesthetic without complications, prepped and draped in usual sterile fashion, catheterized until the bladder was empty.  After achieving adequate anesthesia, dilute Marcaine solution placed, 20 mL total paracervical block.  Uterus was sounded to approximately 9-10 cm, dilated easily up to #23 [facility] dilator.  Hysteroscope placed.  Visualization reveals endometrial polyp at the fundus, which was removed using polyp forceps.  At this time, the cavity otherwise appears thickened, but emptied.  Sharp curettage in a four quadrant method was used to collect endometrial curettings.  At this time, good  hemostasis noted.  The hysteroscope was removed.  The NovaSure device was placed, set to a length of 6.5, width of 4.1, and initiated after negative CO2 test with a power of 147 watts for 80 seconds, the device was removed, inspected, and found to be intact.  Revisualization reveals an otherwise normal endometrial cavity with a normal tubal ostia and no endometrial defects.  No evidence of uterine perforation.  Good hemostasis was noted.  All instruments removed.  The patient tolerates procedure well and was transferred to recovery in good condition.     Lenoard Aden, M.D.     RJT/MEDQ  D:  11/07/2010  T:  11/07/2010  Job:  [id number]

## 2010-11-07 NOTE — Op Note (Signed)
  Sherri [name] DICTATION # below Sherri [name] DICTATION # [id number] CSN# [id number]  Sherri [name] 11/07/2010 11:20 AM    CSN# [id number]  Sherri [name] 11/07/2010 11:20 AM

## 2010-11-07 NOTE — Anesthesia Preprocedure Evaluation (Addendum)
Anesthesia Evaluation  Name, MR# and DOB Patient awake  General Assessment Comment  Reviewed: Allergy & Precautions, H&P , Patient's Chart, lab work & pertinent test results and reviewed documented beta blocker date and time   History of Anesthesia Complications Negative for: history of anesthetic complications  Airway Mallampati: III      Dental  (+) Teeth Intact   Pulmonary  + rhonchi (resolved with deep breathing and coughing)    Cardiovascular Exercise Tolerance: Good regular Normal   Neuro/Psych (+) {AN ROS/MED HX NEURO HEADACHES (+) Anxiety,   GI/Hepatic/Renal negative Liver ROS, and negative Renal ROS (+)     IBS   Endo/Other   (+)  Hypothyroidism (s/p thyroidectomy and radiation treatment for thyroid cancer), Morbid obesity Abdominal   Musculoskeletal negative musculoskeletal ROS (+)  Hematology negative hematology ROS (+)   Peds  Reproductive/Obstetrics negative OB ROS   Anesthesia Other Findings             Anesthesia Physical Anesthesia Plan  ASA: III  Anesthesia Plan: MAC   Post-op Pain Management:    Induction:   Airway Management Planned:   Additional Equipment:   Intra-op Plan:   Post-operative Plan:   Informed Consent: I have reviewed the patients History and Physical, chart, labs and discussed the procedure including the risks, benefits and alternatives for the proposed anesthesia with the patient or authorized representative who has indicated his/her understanding and acceptance.   Dental Advisory Given  Plan Discussed with:   Anesthesia Plan Comments:         Anesthesia Quick Evaluation

## 2010-11-07 NOTE — Anesthesia Postprocedure Evaluation (Signed)
  Anesthesia Post-op Note  Patient: Sherri [name]  Procedure(s) Performed:  DILATATION & CURETTAGE/HYSTEROSCOPY WITH NOVASURE ABLATION  No anesthesia complications.  Level of consciousness: alert. Cardiopulmonary status stable.  No follow-up care or observation required.  Joelly Bolanos L. Rodman Pickle, MD

## 2010-11-07 NOTE — Progress Notes (Signed)
  Admission note dictated. No changes noted.

## 2010-12-25 ENCOUNTER — Ambulatory Visit (INDEPENDENT_AMBULATORY_CARE_PROVIDER_SITE_OTHER)
Admission: RE | Admit: 2010-12-25 | Discharge: 2010-12-25 | Disposition: A | Discharge status: Home / Self Care | Payer: 59 | Source: Ambulatory Visit | Attending: Emergency Medicine | Admitting: Emergency Medicine

## 2010-12-25 DIAGNOSIS — J984 Other disorders of lung: Secondary | ICD-10-CM

## 2010-12-28 ENCOUNTER — Ambulatory Visit: Payer: 59 | Admitting: Emergency Medicine

## 2011-01-04 ENCOUNTER — Ambulatory Visit: Payer: 59 | Admitting: Emergency Medicine

## 2011-01-10 LAB — CBC
HCT: 37
MCV: 84.5
MCV: 84.6
RBC: 3.54 — ABNORMAL LOW
RBC: 4.38
WBC: 12.8 — ABNORMAL HIGH
WBC: 15.1 — ABNORMAL HIGH

## 2011-01-10 LAB — SAMPLE TO BLOOD BANK

## 2011-01-10 LAB — RPR: RPR Ser Ql: NONREACTIVE

## 2011-01-15 LAB — HEMOGLOBIN AND HEMATOCRIT, BLOOD
HCT: 38.1
Hemoglobin: 12.9

## 2011-02-08 HISTORY — PX: MODIFIED RADICAL NECK DISSECTION: SHX2045

## 2011-04-15 ENCOUNTER — Ambulatory Visit
Admission: RE | Admit: 2011-04-15 | Discharge: 2011-04-15 | Disposition: A | Discharge status: Home / Self Care | Payer: 59 | Source: Ambulatory Visit | Attending: Orthopedic Surgery | Admitting: Orthopedic Surgery

## 2011-04-15 ENCOUNTER — Other Ambulatory Visit: Payer: Self-pay | Admitting: Orthopedic Surgery

## 2011-04-15 DIAGNOSIS — M549 Dorsalgia, unspecified: Secondary | ICD-10-CM

## 2011-07-01 ENCOUNTER — Telehealth: Payer: Self-pay | Admitting: Emergency Medicine

## 2011-07-01 DIAGNOSIS — R911 Solitary pulmonary nodule: Secondary | ICD-10-CM

## 2011-07-01 NOTE — Telephone Encounter (Signed)
Called, spoke with pt.  She had a CT Chest non contrast done in Sept 2012 and was told she will need to have this repeated in April 2013.  She would like to have this scheduled. Dr. Delton Coombes, are you ok with this order?  Please advise.  Thank you.

## 2011-07-02 NOTE — Telephone Encounter (Signed)
Yes please order CT scan no contrast

## 2011-07-02 NOTE — Telephone Encounter (Signed)
CT scan has been ordered. PCC's will contact the pt with the appt.  LMOMTCB x 1.

## 2011-07-03 NOTE — Telephone Encounter (Signed)
I spoke with pt and she is aware. 

## 2011-07-30 ENCOUNTER — Ambulatory Visit (INDEPENDENT_AMBULATORY_CARE_PROVIDER_SITE_OTHER)
Admission: RE | Admit: 2011-07-30 | Discharge: 2011-07-30 | Disposition: A | Discharge status: Home / Self Care | Payer: 59 | Source: Ambulatory Visit | Attending: Emergency Medicine | Admitting: Emergency Medicine

## 2011-07-30 DIAGNOSIS — R911 Solitary pulmonary nodule: Secondary | ICD-10-CM

## 2011-07-31 ENCOUNTER — Other Ambulatory Visit: Payer: 59

## 2011-12-21 IMAGING — CT CT CHEST W/O CM
1 series · 16 of 32 positions shown, 20 images · IV contrast (Omnipaque 300)
Comparison: July 13, 2010 and April 12, 2010

CLINICAL DATA: Pulmonary nodules

CT CHEST WITHOUT CONTRAST
TECHNIQUE: Multidetector CT imaging of the chest was performed
following the standard protocol without IV contrast.

[Series 2: chest routine with · axial · 0.70mm/px · z∈[-262,-12]mm · 16 of 55 slices shown, 20 images]
[im 3/55  mediastinal]
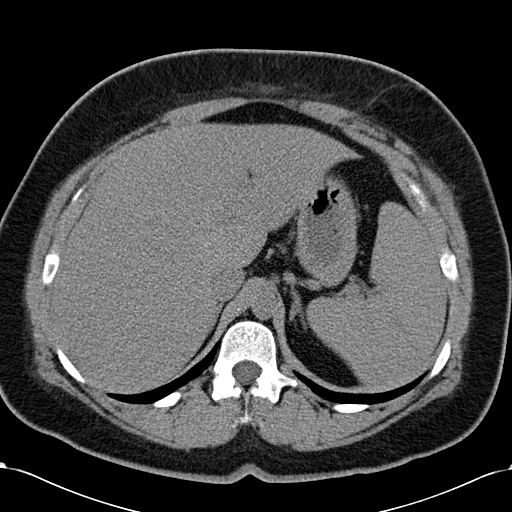
[im 3/55  lung]
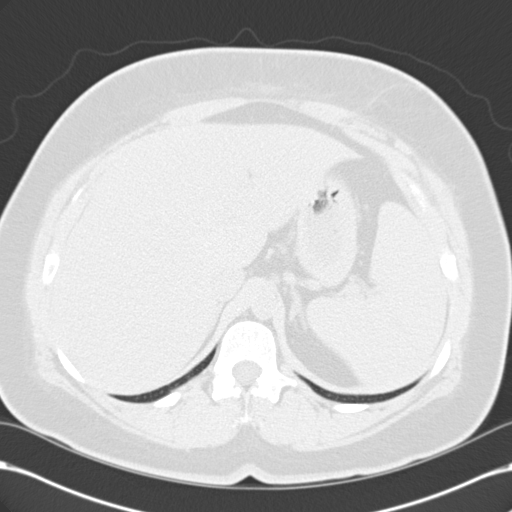
[im 7/55  lung]
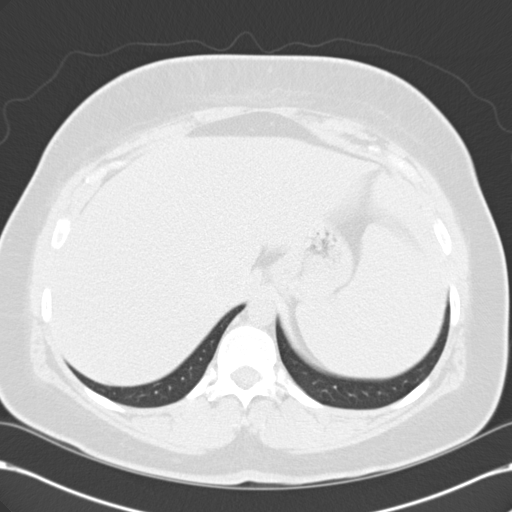
[im 11/55  lung]
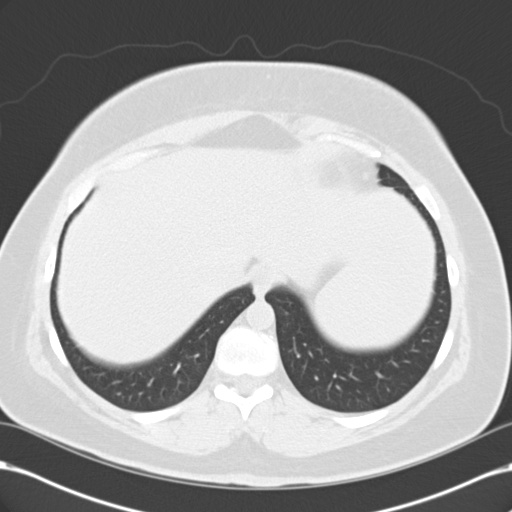
[im 15/55  lung]
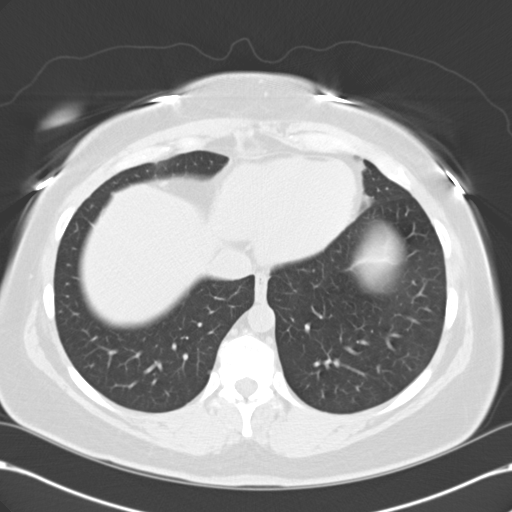
[im 19/55  mediastinal]
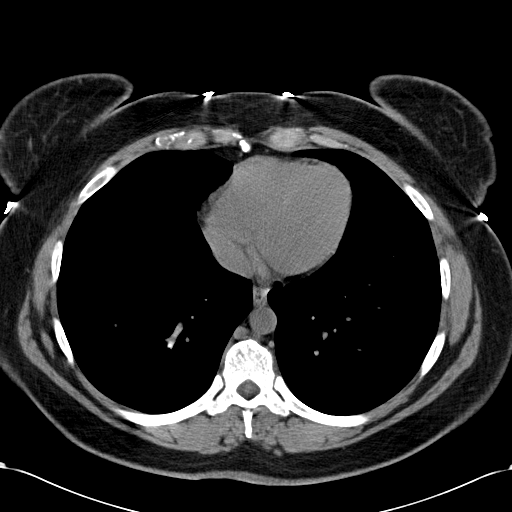
[im 19/55  lung]
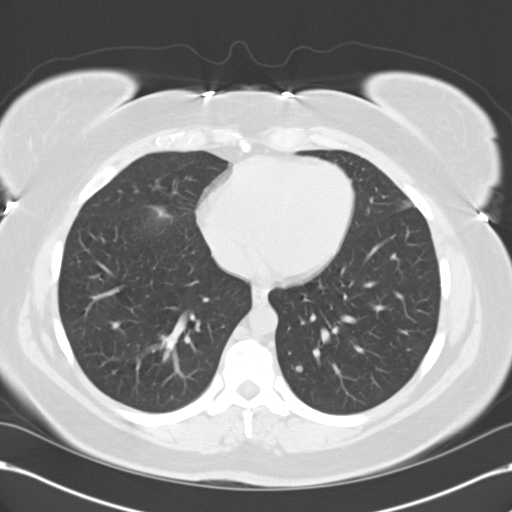
[im 22/55  lung]
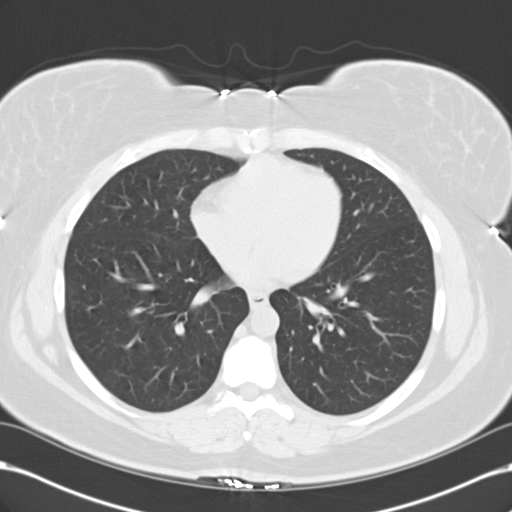
[im 25/55  lung]
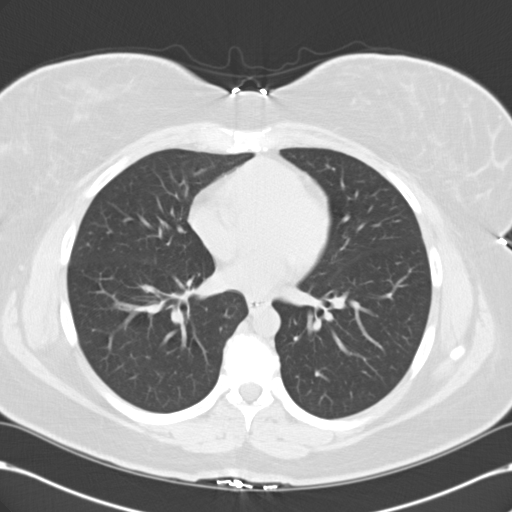
[im 27/55  lung]
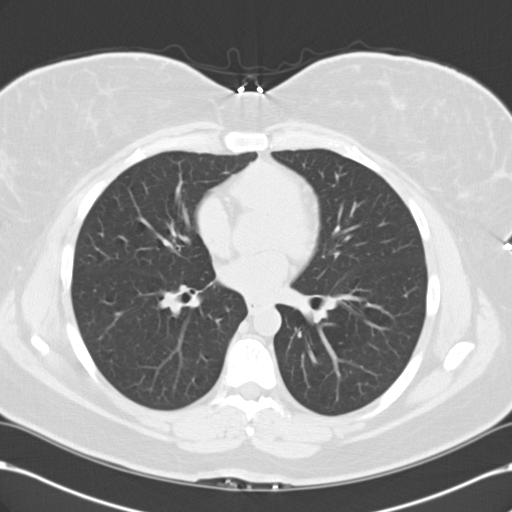
[im 29/55  mediastinal]
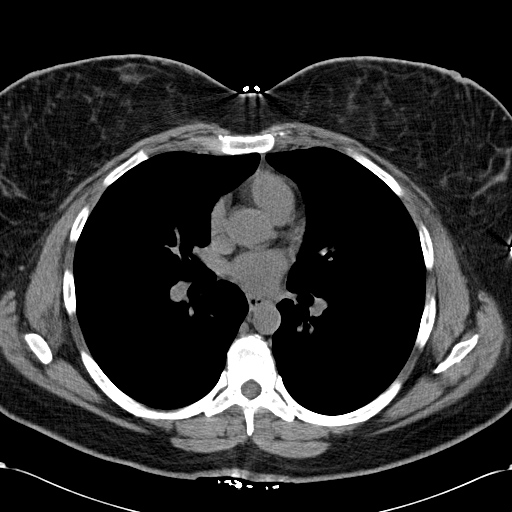
[im 29/55  lung]
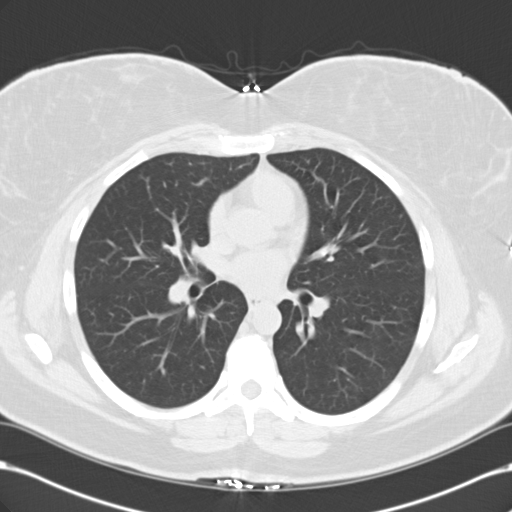
[im 31/55  lung]
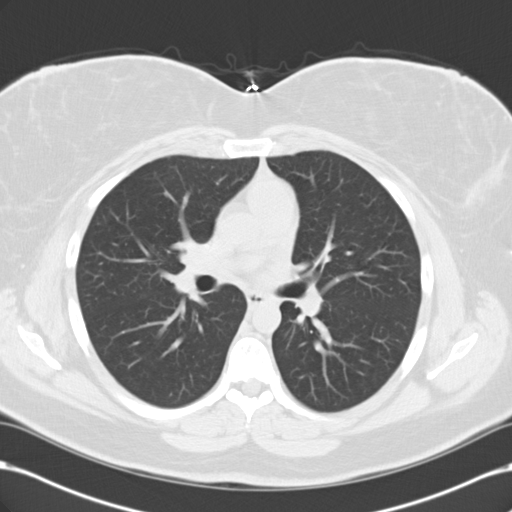
[im 35/55  lung]
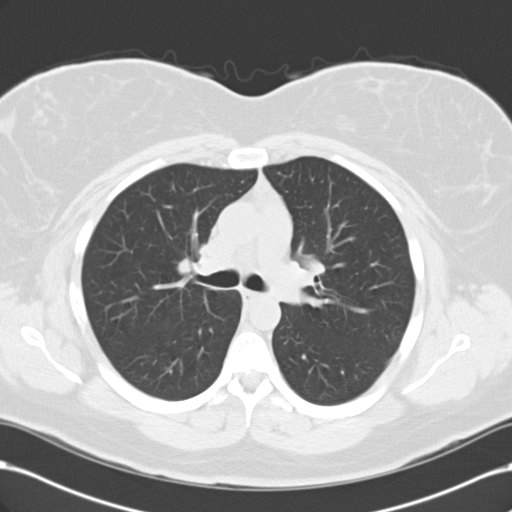
[im 39/55  lung]
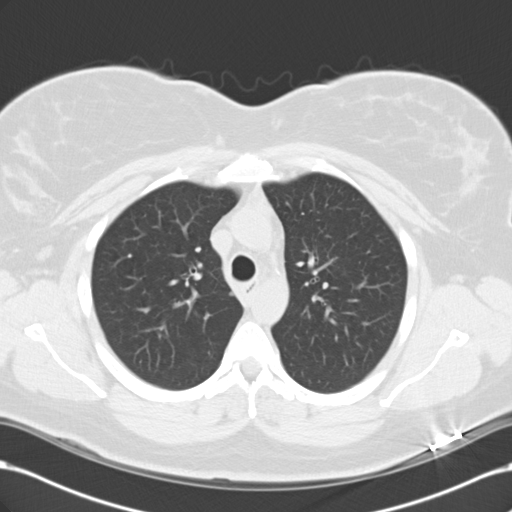
[im 43/55  mediastinal]
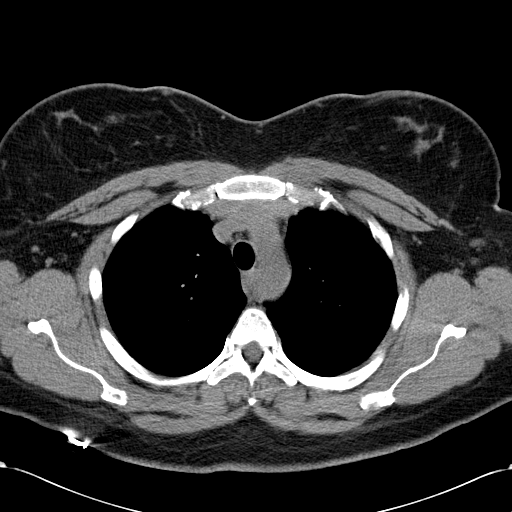
[im 43/55  lung]
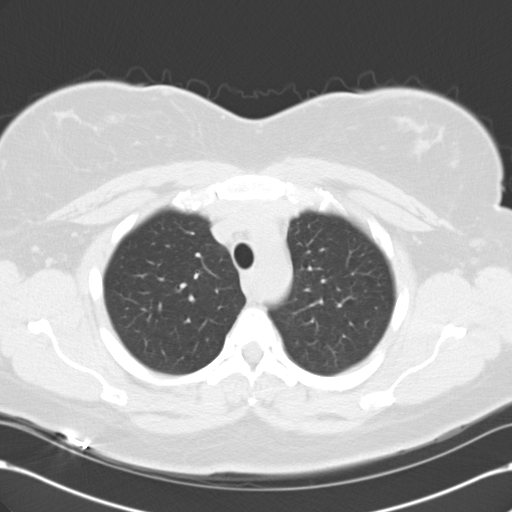
[im 45/55  lung]
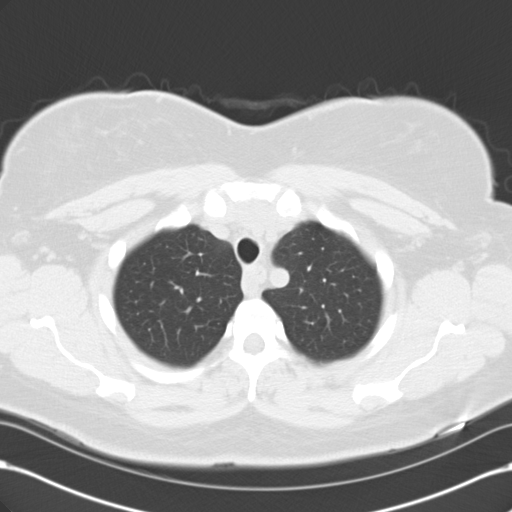
[im 49/55  lung]
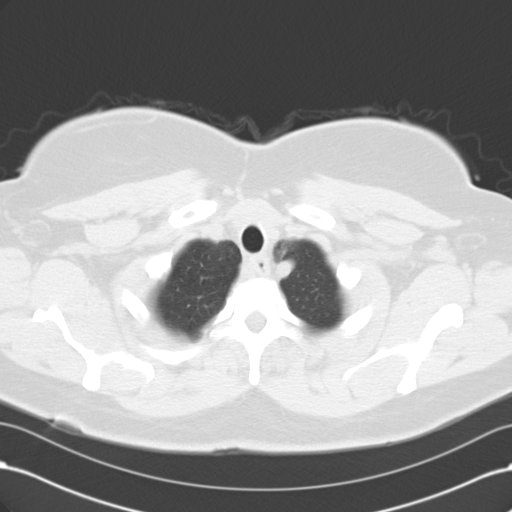
[im 53/55  lung]
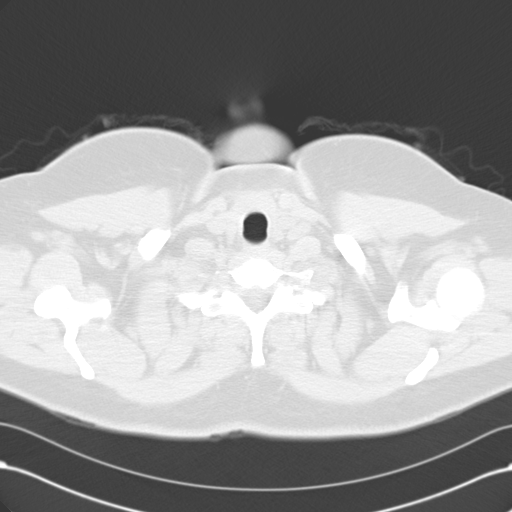

[16 of 32 positions shown; findings below may reference images not displayed]

FINDINGS: Scattered nodules in the right lung are all stable to
slightly smaller today.  The largest is on image 40, measuring
mm.

The solitary nodule in the left lower lobe is also slightly smaller
today, measuring 3.8 mm on image 37.  There are new no new or
suspicious nodules.

 There is no axillary, mediastinal, or gross hilar adenopathy.  The
visualized upper abdomen and osseous structures are unremarkable.
IMPRESSION: Bilateral pulmonary nodules measures stable to smaller today than
on the previous studies.  One additional follow-up study in 1 year
recommended.

## 2012-04-15 HISTORY — PX: HEMORRHOID BANDING: SHX5850

## 2013-02-25 ENCOUNTER — Ambulatory Visit (INDEPENDENT_AMBULATORY_CARE_PROVIDER_SITE_OTHER): Payer: 59 | Admitting: Sports Medicine

## 2013-02-25 ENCOUNTER — Encounter: Payer: Self-pay | Admitting: Sports Medicine

## 2013-02-25 VITALS — BP 126/80 | Ht 64.0 in | Wt 220.0 lb

## 2013-02-25 DIAGNOSIS — M25579 Pain in unspecified ankle and joints of unspecified foot: Secondary | ICD-10-CM

## 2013-02-25 DIAGNOSIS — M79671 Pain in right foot: Secondary | ICD-10-CM

## 2013-02-25 DIAGNOSIS — M79609 Pain in unspecified limb: Secondary | ICD-10-CM

## 2013-02-25 NOTE — Progress Notes (Signed)
Patient ID: Sherri [name], female   DOB: March 04, 1976, 37 y.o.   MRN: [id number] 37 year old female with complaint of bilateral foot pain. Pain was noted on the lateral aspect of her feet. Worse with walking. She's tried multiple shoes and not obtain significant relief. She is a borderline diabetic on walking program in the pain is starting to affect her ability to continue her walking program. Denies any significant injury. She's used orthotics which did not alleviate the symptoms. She finds the best relief wearing Birkenstocks.   Past Medical History  Diagnosis Date  . Tobacco abuse   . Migraine headache   . Cancer 1/12    THYROID   Past Surgical History  Procedure Laterality Date  . Hemorrhoid surgery  2009    hemorrhoidectomy  . Cesarean section  2007 and 2009  . Total thyroidectomy  1/12    with node dissection   Allergies  Allergen Reactions  . Clindamycin/Lincomycin     Vaginal itching  . Codeine Itching  . Hydromorphone Itching  . Penicillins Itching  . Varenicline     Severe Hallucinations/insomnia/severe depression when took with Topamax  . Vicodin [Hydrocodone-Acetaminophen] Itching   Review of systems as per history of present illness otherwise negative  Examination: BP 126/80  Ht 5\' 4"  (1.626 m)  Wt 220 lb (99.791 kg)  BMI 37.74 kg/m2 Well-developed well-nourished 37 year old female awake alert and oriented in no acute distress.  Feet examination: Tenderness to palpation the region of the proximal fourth and fifth metatarsal heads bilaterally. Lateral: Breakdown.  Neutral position hindfoot.  Cavus foot.  Gait:  Neutral without excessive pronation or supination.   After watching her gait in assessing her feet, 4th and 5th ray posting was done to several pairs of insoles and gait reassessed. This appeared to improve her symptoms.   Patient will followup as needed.

## 2013-02-25 NOTE — Assessment & Plan Note (Signed)
Fifth ray posting was done to her orthotics as well as the insoles in her shoes.  Fifth metatarsal pad was also placed under the sole of one of her peers of sandals. She'll followup as needed

## 2013-04-15 HISTORY — PX: TOOTH EXTRACTION: SUR596

## 2013-07-15 ENCOUNTER — Ambulatory Visit (INDEPENDENT_AMBULATORY_CARE_PROVIDER_SITE_OTHER): Payer: 59 | Admitting: Family Medicine

## 2013-07-15 ENCOUNTER — Ambulatory Visit: Payer: 59

## 2013-07-15 VITALS — BP 126/72 | HR 88 | Temp 98.2°F | Resp 16 | Ht 64.5 in | Wt 195.0 lb

## 2013-07-15 DIAGNOSIS — R109 Unspecified abdominal pain: Secondary | ICD-10-CM

## 2013-07-15 DIAGNOSIS — K5900 Constipation, unspecified: Secondary | ICD-10-CM

## 2013-07-15 LAB — POCT CBC
Granulocyte percent: 71.2 %G (ref 37–80)
HCT, POC: 44.2 % (ref 37.7–47.9)
HEMOGLOBIN: 14.4 g/dL (ref 12.2–16.2)
LYMPH, POC: 3.2 (ref 0.6–3.4)
MCH, POC: 27.9 pg (ref 27–31.2)
MCHC: 32.6 g/dL (ref 31.8–35.4)
MCV: 85.5 fL (ref 80–97)
MID (cbc): 0.5 (ref 0–0.9)
MPV: 9.4 fL (ref 0–99.8)
PLATELET COUNT, POC: 339 10*3/uL (ref 142–424)
POC GRANULOCYTE: 9.2 — AB (ref 2–6.9)
POC LYMPH PERCENT: 24.8 %L (ref 10–50)
POC MID %: 4 % (ref 0–12)
RBC: 5.17 M/uL (ref 4.04–5.48)
RDW, POC: 13.4 %
WBC: 12.9 10*3/uL — AB (ref 4.6–10.2)

## 2013-07-15 LAB — POCT UA - MICROSCOPIC ONLY
BACTERIA, U MICROSCOPIC: NEGATIVE
CASTS, UR, LPF, POC: NEGATIVE
Crystals, Ur, HPF, POC: NEGATIVE
Epithelial cells, urine per micros: NEGATIVE
Mucus, UA: NEGATIVE
RBC, urine, microscopic: NEGATIVE
WBC, Ur, HPF, POC: NEGATIVE
YEAST UA: NEGATIVE

## 2013-07-15 LAB — POCT URINALYSIS DIPSTICK
BILIRUBIN UA: NEGATIVE
Blood, UA: NEGATIVE
Glucose, UA: NEGATIVE
Ketones, UA: NEGATIVE
LEUKOCYTES UA: NEGATIVE
NITRITE UA: NEGATIVE
PH UA: 6.5
PROTEIN UA: NEGATIVE
Spec Grav, UA: 1.015
UROBILINOGEN UA: 0.2

## 2013-07-15 NOTE — Progress Notes (Signed)
Subjective: Patient had a long history over the past months of intermittent abdominal pain. Actually she has had a lot of bowel and colon problems over the years. She's had a lot of Metamucil and other things for her intestines. She's had problems with rectal fissures and is seeing a Dr. in [place] tomorrow for that. She went to her OB/GYN because she's been having low abdominal pains. He did a transvaginal ultrasound on her this morning. Apparently the probe caused a good deal of pain. The ovaries were okay but then to push the intestine up some to be able to get good pictures. She had so much cramping pain she came on over here. She also had a lot of pain last night. That is why she went to the GYN in the first place. She tried to see her gastroenterologist today, but he is out with a medical issue and will be back until June. His office suggested she come on over here.  Objective: Moderately overweight lady in no major acute distress. Chest clear. Heart regular without murmurs. Throat was clear. Abdomen has normal bowel sounds. Soft without organomegaly or masses but has generalized tenderness. She is tender across the upper abdomen she is tender in the left lower quadrant she is tender little bit in the right lower quadrant. She has the sensation of the swelling in the right groin region but I cannot define a definite hernia there. She apparently has some abscesses on her labia and buttock that her GYN has been treating her with doxycycline.  Assessment: Nonspecific abdominal pain  Plan: X-rays of abdomen, CBC and urinalysis, Cmet  [facility] reading (PRIMARY) by  Dr. Linna Darner Non-specific abdomen .         Results for orders placed in visit on 07/15/13  POCT CBC      Result Value Ref Range   WBC 12.9 (*) 4.6 - 10.2 K/uL   Lymph, poc 3.2  0.6 - 3.4   POC LYMPH PERCENT 24.8  10 - 50 %L   MID (cbc) 0.5  0 - 0.9   POC MID % 4.0  0 - 12 %M   POC Granulocyte 9.2 (*) 2 - 6.9   Granulocyte  percent 71.2  37 - 80 %G   RBC 5.17  4.04 - 5.48 M/uL   Hemoglobin 14.4  12.2 - 16.2 g/dL   HCT, POC 44.2  37.7 - 47.9 %   MCV 85.5  80 - 97 fL   MCH, POC 27.9  27 - 31.2 pg   MCHC 32.6  31.8 - 35.4 g/dL   RDW, POC 13.4     Platelet Count, POC 339  142 - 424 K/uL   MPV 9.4  0 - 99.8 fL  POCT URINALYSIS DIPSTICK      Result Value Ref Range   Color, UA yellow     Clarity, UA clear     Glucose, UA neg     Bilirubin, UA neg     Ketones, UA neg     Spec Grav, UA 1.015     Blood, UA neg     pH, UA 6.5     Protein, UA neg     Urobilinogen, UA 0.2     Nitrite, UA neg     Leukocytes, UA Negative    POCT UA - MICROSCOPIC ONLY      Result Value Ref Range   WBC, Ur, HPF, POC neg     RBC, urine, microscopic neg  Bacteria, U Microscopic neg     Mucus, UA neg     Epithelial cells, urine per micros neg     Crystals, Ur, HPF, POC neg     Casts, Ur, LPF, POC neg     Yeast, UA neg      [facility] reading (PRIMARY) by  Dr. Linna Darner Nonspecific abdomen  I think her problem is from constipation. The little elevation in the white count is probably from the abscesses she has. However, sooner if she gets worse she needs to come back emergency room her I'm going to treat her with laxatives to clean her out some. She'll the x-ray of all the stool but still in area though she says she's had a half dozen bowel movements today. I suspect she is moving her bowels around some constipation. We'll use MiraLax and see a she does. Return if worse. Advise she see her gastroenterologist up.  Marland Kitchen

## 2013-07-15 NOTE — Patient Instructions (Signed)
I think your symptoms are consistent with intermittent pains for intermittent to high constipation.  Take MiraLax once daily until bowels are on the loose side consistently. If necessary take twice daily initially. Usually once we'll do the job.  Take Colace one daily for stool softener  Hold off on the Metamucil until bowels are moving loose  Followup with a gastroenterologist  If you develop acute abdominal pain or recurrent vomiting or are passing blood or high fevers or generally worse return for recheck here or go to emergency room

## 2013-07-16 LAB — COMPREHENSIVE METABOLIC PANEL
ALBUMIN: 4.5 g/dL (ref 3.5–5.2)
ALT: 12 U/L (ref 0–35)
AST: 10 U/L (ref 0–37)
Alkaline Phosphatase: 62 U/L (ref 39–117)
BUN: 6 mg/dL (ref 6–23)
CALCIUM: 9.5 mg/dL (ref 8.4–10.5)
CHLORIDE: 104 meq/L (ref 96–112)
CO2: 25 mEq/L (ref 19–32)
Creat: 0.59 mg/dL (ref 0.50–1.10)
Glucose, Bld: 100 mg/dL — ABNORMAL HIGH (ref 70–99)
POTASSIUM: 4.4 meq/L (ref 3.5–5.3)
SODIUM: 141 meq/L (ref 135–145)
TOTAL PROTEIN: 7.3 g/dL (ref 6.0–8.3)
Total Bilirubin: 0.6 mg/dL (ref 0.2–1.2)

## 2013-07-17 ENCOUNTER — Encounter: Payer: Self-pay | Admitting: *Deleted

## 2013-07-20 ENCOUNTER — Telehealth: Payer: Self-pay

## 2013-08-13 ENCOUNTER — Other Ambulatory Visit: Payer: Self-pay | Admitting: Urology

## 2013-08-13 ENCOUNTER — Encounter (HOSPITAL_BASED_OUTPATIENT_CLINIC_OR_DEPARTMENT_OTHER): Payer: Self-pay | Admitting: *Deleted

## 2013-08-16 ENCOUNTER — Encounter (HOSPITAL_BASED_OUTPATIENT_CLINIC_OR_DEPARTMENT_OTHER): Payer: Self-pay | Admitting: *Deleted

## 2013-08-16 NOTE — Progress Notes (Signed)
NPO AFTER MN. ARRIVE AT 0600.  NEEDS HG.  WILL TAKE SYNTHROID AND ZANTAC AM DOS W/ SIPS OF WATER.  REVIEWED CHART W/ DR GERMEROTH MDA,  NO CXR ORDERED,  UNLESS INDICATED DOS BY MDA ASSESSMENT.

## 2013-08-16 NOTE — H&P (Signed)
History of Present Illness F/u - referred by Dr. Ronita Hipps, her PCP is Dr. Leonides Cave .     1-pelvic pain - Apr 2015 - pelvic pain shooting into "clitoris or urethra". Also pain in SP area. No h/o stones. She has back pain. BM stable at 5-6 x per day. She typically voids with a good flow and feels empty. She has some frequency and urgency. She gets discomfort with urgency. No dyspareunia. Pelvic surgery includes C-section 2/tubal ligation 2009, Novasure ablation in 2012, also has IBS, chronic abdominal pain, anal fissures/abscesses. Normal pelvic exam February 2015. No neurogenic risks.  -Apr 2015 rec PT    2-incontinence - Since 2007 she leaks with cough, sneeze laugh. She leaks with standing. She has no leakage with urgency. No pad use but she might need to change underwear at times.   -Apr 2015 rec PT, avoid antimuscarinic due to IBS        Apr 2015 interval hx  She returns and complains of suprapubic pain with frequency and urgency. Pain improves with voiding.   Past Medical History Problems  1. History of Anxiety (300.00) 2. History of esophageal reflux (V12.79) 3. History of hypertension (V12.59) 4. History of malignant neoplasm of thyroid (V10.87)  Surgical History Problems  1. History of Neck Surgery 2. History of Thyroid Surgery  Current Meds 1. Ibuprofen TABS;  Therapy: (Recorded:27Feb2015) to Recorded 2. Levothyroxine Sodium 150 MCG Oral Tablet;  Therapy: (Recorded:30Apr2015) to Recorded  Allergies Medication  1. Codeine Derivatives 2. Penicillins  Family History Problems  1. Family history of malignant neoplasm of breast (V16.3) : Mother 2. Family history of malignant neoplasm of thyroid (V16.8) : Grandmother 3. Family history of pneumonia (V18.8) : Father 4. Family history of Intestinal cancer : Grandmother  Social History Problems  1. Current every day smoker (305.1) 2. Daily caffeine consumption, 2-3 servings a day 3. Father deceased 45.  Married 5. Minimum alcohol consumption 6. Mother alive 64. Three children  Vitals Vital Signs [Data Includes: Last 1 Day]  Recorded: 30Apr2015 08:52AM  Blood Pressure: 124 / 81 Temperature: 97.5 F Heart Rate: 59  Physical Exam Constitutional: Well nourished and well developed . No acute distress.  ENT:. The ears and nose are normal in appearance.  Neck: The appearance of the neck is normal and no neck mass is present.  Pulmonary: No respiratory distress and normal respiratory rhythm and effort.  Cardiovascular: Heart rate and rhythm are normal . No peripheral edema.  Abdomen: The abdomen is soft and nontender. No masses are palpated. No CVA tenderness. No hernias are palpable. No hepatosplenomegaly noted.  Genitourinary:  Chaperone Present: erica.  Examination of the external genitalia shows normal female external genitalia and no lesions. The urethra is normal in appearance and not tender. There is no urethral mass. Vaginal exam demonstrates no abnormalities. The bladder is tender, but not distended and without masses. The anus is normal on inspection. The perineum is normal on inspection.  Lymphatics: The femoral and inguinal nodes are not enlarged or tender.  Skin: Normal skin turgor, no visible rash and no visible skin lesions.  Neuro/Psych:. Mood and affect are appropriate.    Results/Data Urine [Data Includes: Last 1 Day]   30Apr2015  COLOR YELLOW   APPEARANCE CLEAR   SPECIFIC GRAVITY 1.010   pH 7.0   GLUCOSE NEG mg/dL  BILIRUBIN NEG   KETONE NEG mg/dL  BLOOD TRACE   PROTEIN NEG mg/dL  UROBILINOGEN 0.2 mg/dL  NITRITE NEG   LEUKOCYTE ESTERASE NEG  SQUAMOUS EPITHELIAL/HPF FEW   WBC NONE SEEN WBC/hpf  RBC 0-2 RBC/hpf  BACTERIA NONE SEEN   CRYSTALS NONE SEEN   CASTS NONE SEEN    Procedure  Procedure: Cystoscopy  Chaperone Present: erica.  Informed Consent: Risks, benefits, and potential adverse events were discussed and informed consent was obtained from the  patient.  Prep: The patient was prepped with betadine.  Procedure Note:  Urethral meatus:. No abnormalities.  Anterior urethra: No abnormalities.  Bladder: Visulization was clear. The ureteral orifices were in the normal anatomic position bilaterally and had clear efflux of urine. A systematic survey of the bladder demonstrated no bladder tumors or stones. The mucosa was smooth without abnormalities. The patient tolerated the procedure well.  Complications: None.    Assessment Assessed  1. Urinary urgency (788.63) 2. Female pelvic pain (625.9)  Plan Female pelvic pain  1. Follow-up Schedule Surgery Office  Follow-up  Status: Complete  Done: 30Apr2015  Discussion/Summary She has symptoms consistent with IC we discussed the nature risk and benefits of cystoscopy with hydrodistention under anesthesia. We discussed is primarily a diagnostic test and risks of pain, infection and bleeding among others. We discussed the risk of bladder perforation and management. We discussed empiric diet changes and physical therapy, medicines by mouth and instillations in office. She would like to get more information and elects to proceed with cystoscopy and hydrodistention under anesthesia.       Signatures Electronically signed by : Festus Aloe, M.D.; Aug 12 2013  9:43AM EST

## 2013-08-17 ENCOUNTER — Encounter (HOSPITAL_BASED_OUTPATIENT_CLINIC_OR_DEPARTMENT_OTHER)
Admission: RE | Disposition: A | Discharge status: Home / Self Care | Payer: Self-pay | Source: Ambulatory Visit | Attending: Urology

## 2013-08-17 ENCOUNTER — Ambulatory Visit (HOSPITAL_BASED_OUTPATIENT_CLINIC_OR_DEPARTMENT_OTHER)
Admission: RE | Admit: 2013-08-17 | Discharge: 2013-08-17 | Disposition: A | Discharge status: Home / Self Care | Payer: 59 | Source: Ambulatory Visit | Attending: Urology | Admitting: Urology

## 2013-08-17 ENCOUNTER — Ambulatory Visit (HOSPITAL_BASED_OUTPATIENT_CLINIC_OR_DEPARTMENT_OTHER): Payer: 59 | Admitting: Anesthesiology

## 2013-08-17 ENCOUNTER — Encounter (HOSPITAL_BASED_OUTPATIENT_CLINIC_OR_DEPARTMENT_OTHER): Payer: Self-pay | Admitting: *Deleted

## 2013-08-17 ENCOUNTER — Encounter (HOSPITAL_BASED_OUTPATIENT_CLINIC_OR_DEPARTMENT_OTHER): Payer: 59 | Admitting: Anesthesiology

## 2013-08-17 DIAGNOSIS — E039 Hypothyroidism, unspecified: Secondary | ICD-10-CM | POA: Insufficient documentation

## 2013-08-17 DIAGNOSIS — Z79899 Other long term (current) drug therapy: Secondary | ICD-10-CM | POA: Insufficient documentation

## 2013-08-17 DIAGNOSIS — R3915 Urgency of urination: Secondary | ICD-10-CM | POA: Insufficient documentation

## 2013-08-17 DIAGNOSIS — Z885 Allergy status to narcotic agent status: Secondary | ICD-10-CM | POA: Insufficient documentation

## 2013-08-17 DIAGNOSIS — I10 Essential (primary) hypertension: Secondary | ICD-10-CM | POA: Insufficient documentation

## 2013-08-17 DIAGNOSIS — Z880 Allergy status to penicillin: Secondary | ICD-10-CM | POA: Insufficient documentation

## 2013-08-17 DIAGNOSIS — K589 Irritable bowel syndrome without diarrhea: Secondary | ICD-10-CM | POA: Insufficient documentation

## 2013-08-17 DIAGNOSIS — F411 Generalized anxiety disorder: Secondary | ICD-10-CM | POA: Insufficient documentation

## 2013-08-17 DIAGNOSIS — R350 Frequency of micturition: Secondary | ICD-10-CM | POA: Insufficient documentation

## 2013-08-17 DIAGNOSIS — Z808 Family history of malignant neoplasm of other organs or systems: Secondary | ICD-10-CM | POA: Insufficient documentation

## 2013-08-17 DIAGNOSIS — N393 Stress incontinence (female) (male): Secondary | ICD-10-CM | POA: Insufficient documentation

## 2013-08-17 DIAGNOSIS — N949 Unspecified condition associated with female genital organs and menstrual cycle: Secondary | ICD-10-CM | POA: Insufficient documentation

## 2013-08-17 DIAGNOSIS — K219 Gastro-esophageal reflux disease without esophagitis: Secondary | ICD-10-CM | POA: Insufficient documentation

## 2013-08-17 DIAGNOSIS — Z85850 Personal history of malignant neoplasm of thyroid: Secondary | ICD-10-CM | POA: Insufficient documentation

## 2013-08-17 DIAGNOSIS — Z800 Family history of malignant neoplasm of digestive organs: Secondary | ICD-10-CM | POA: Insufficient documentation

## 2013-08-17 DIAGNOSIS — Z803 Family history of malignant neoplasm of breast: Secondary | ICD-10-CM | POA: Insufficient documentation

## 2013-08-17 DIAGNOSIS — F17200 Nicotine dependence, unspecified, uncomplicated: Secondary | ICD-10-CM | POA: Insufficient documentation

## 2013-08-17 HISTORY — DX: Urgency of urination: R39.15

## 2013-08-17 HISTORY — DX: Gastro-esophageal reflux disease without esophagitis: K21.9

## 2013-08-17 HISTORY — DX: Other complications of anesthesia, initial encounter: T88.59XA

## 2013-08-17 HISTORY — DX: Other nonspecific abnormal finding of lung field: R91.8

## 2013-08-17 HISTORY — DX: Frequency of micturition: R35.0

## 2013-08-17 HISTORY — DX: Personal history of malignant neoplasm of thyroid: Z85.850

## 2013-08-17 HISTORY — DX: Adverse effect of unspecified anesthetic, initial encounter: T41.45XA

## 2013-08-17 HISTORY — DX: Personal history of irradiation: Z92.3

## 2013-08-17 HISTORY — DX: Other symptoms and signs involving the genitourinary system: R39.89

## 2013-08-17 HISTORY — DX: Postprocedural hypothyroidism: E89.0

## 2013-08-17 HISTORY — DX: Sleep apnea, unspecified: G47.30

## 2013-08-17 HISTORY — PX: CYSTO WITH HYDRODISTENSION: SHX5453

## 2013-08-17 HISTORY — DX: Melena: K92.1

## 2013-08-17 LAB — POCT HEMOGLOBIN-HEMACUE: Hemoglobin: 13.8 g/dL (ref 12.0–15.0)

## 2013-08-17 SURGERY — CYSTOSCOPY, WITH BLADDER HYDRODISTENSION
Anesthesia: General | Site: Bladder

## 2013-08-17 MED ORDER — FENTANYL CITRATE 0.05 MG/ML IJ SOLN
INTRAMUSCULAR | Status: DC | PRN
  Administered 2013-08-17: 25 ug via INTRAVENOUS
  Administered 2013-08-17 (×3): 50 ug via INTRAVENOUS
  Administered 2013-08-17: 25 ug via INTRAVENOUS

## 2013-08-17 MED ORDER — FENTANYL CITRATE 0.05 MG/ML IJ SOLN
25.0000 ug | INTRAMUSCULAR | Status: DC | PRN
  Filled 2013-08-17: qty 1

## 2013-08-17 MED ORDER — LIDOCAINE HCL (CARDIAC) 20 MG/ML IV SOLN
INTRAVENOUS | Status: DC | PRN
  Administered 2013-08-17: 100 mg via INTRAVENOUS

## 2013-08-17 MED ORDER — ACETAMINOPHEN 10 MG/ML IV SOLN
INTRAVENOUS | Status: DC | PRN
  Administered 2013-08-17: 1000 mg via INTRAVENOUS

## 2013-08-17 MED ORDER — KETOROLAC TROMETHAMINE 30 MG/ML IJ SOLN
15.0000 mg | Freq: Once | INTRAMUSCULAR | Status: DC | PRN
  Filled 2013-08-17: qty 1

## 2013-08-17 MED ORDER — HYOSCYAMINE SULFATE 0.125 MG SL SUBL
SUBLINGUAL_TABLET | SUBLINGUAL | Status: AC
  Filled 2013-08-17: qty 1

## 2013-08-17 MED ORDER — STERILE WATER FOR IRRIGATION IR SOLN
Status: DC | PRN
  Administered 2013-08-17: 3000 mL

## 2013-08-17 MED ORDER — FENTANYL CITRATE 0.05 MG/ML IJ SOLN
INTRAMUSCULAR | Status: AC
  Filled 2013-08-17: qty 4

## 2013-08-17 MED ORDER — PROMETHAZINE HCL 25 MG/ML IJ SOLN
6.2500 mg | INTRAMUSCULAR | Status: DC | PRN
  Filled 2013-08-17: qty 1

## 2013-08-17 MED ORDER — MIDAZOLAM HCL 5 MG/5ML IJ SOLN
INTRAMUSCULAR | Status: DC | PRN
  Administered 2013-08-17: 2 mg via INTRAVENOUS

## 2013-08-17 MED ORDER — TRAMADOL HCL 50 MG PO TABS
50.0000 mg | ORAL_TABLET | Freq: Four times a day (QID) | ORAL | Status: DC | PRN
Start: 2013-08-17 — End: 2014-03-12

## 2013-08-17 MED ORDER — DIPHENHYDRAMINE HCL 50 MG/ML IJ SOLN
INTRAMUSCULAR | Status: AC
  Filled 2013-08-17: qty 1

## 2013-08-17 MED ORDER — HYOSCYAMINE SULFATE 0.125 MG SL SUBL
0.1250 mg | SUBLINGUAL_TABLET | Freq: Once | SUBLINGUAL | Status: AC
  Administered 2013-08-17: 0.125 mg via SUBLINGUAL
  Filled 2013-08-17: qty 1

## 2013-08-17 MED ORDER — MIDAZOLAM HCL 2 MG/2ML IJ SOLN
INTRAMUSCULAR | Status: AC
  Filled 2013-08-17: qty 2

## 2013-08-17 MED ORDER — LACTATED RINGERS IV SOLN
INTRAVENOUS | Status: DC
  Administered 2013-08-17: 07:00:00 via INTRAVENOUS
  Filled 2013-08-17: qty 1000

## 2013-08-17 MED ORDER — ONDANSETRON HCL 4 MG/2ML IJ SOLN
INTRAMUSCULAR | Status: DC | PRN
  Administered 2013-08-17: 4 mg via INTRAVENOUS

## 2013-08-17 MED ORDER — CIPROFLOXACIN IN D5W 400 MG/200ML IV SOLN
400.0000 mg | INTRAVENOUS | Status: AC
  Administered 2013-08-17: 400 mg via INTRAVENOUS
  Filled 2013-08-17: qty 200

## 2013-08-17 MED ORDER — DEXAMETHASONE SODIUM PHOSPHATE 4 MG/ML IJ SOLN
INTRAMUSCULAR | Status: DC | PRN
  Administered 2013-08-17: 10 mg via INTRAVENOUS

## 2013-08-17 MED ORDER — SULFAMETHOXAZOLE-TRIMETHOPRIM 400-80 MG PO TABS: 1.0000 | ORAL_TABLET | Freq: Two times a day (BID) | ORAL | Status: DC

## 2013-08-17 MED ORDER — KETOROLAC TROMETHAMINE 30 MG/ML IJ SOLN
INTRAMUSCULAR | Status: DC | PRN
  Administered 2013-08-17: 30 mg via INTRAVENOUS

## 2013-08-17 MED ORDER — DIPHENHYDRAMINE HCL 50 MG/ML IJ SOLN
12.5000 mg | Freq: Once | INTRAMUSCULAR | Status: AC
  Administered 2013-08-17: 12.5 mg via INTRAVENOUS
  Filled 2013-08-17: qty 0.25

## 2013-08-17 MED ORDER — PROPOFOL 10 MG/ML IV BOLUS
INTRAVENOUS | Status: DC | PRN
  Administered 2013-08-17: 200 mg via INTRAVENOUS

## 2013-08-17 MED ORDER — BUPIVACAINE HCL (PF) 0.5 % IJ SOLN
INTRAMUSCULAR | Status: DC | PRN
  Administered 2013-08-17: 08:00:00 via INTRAVESICAL

## 2013-08-17 SURGICAL SUPPLY — 18 items
BAG DRAIN URO-CYSTO SKYTR STRL (DRAIN) ×2 IMPLANT
BAG DRN UROCATH (DRAIN) ×1
CANISTER SUCT LVC 12 LTR MEDI- (MISCELLANEOUS) ×1 IMPLANT
CATH ROBINSON RED A/P 16FR (CATHETERS) IMPLANT
CATH SILICONE 16FRX5CC (CATHETERS) ×1 IMPLANT
CLOTH BEACON ORANGE TIMEOUT ST (SAFETY) ×2 IMPLANT
DRAPE CAMERA CLOSED 9X96 (DRAPES) ×2 IMPLANT
ELECT REM PT RETURN 9FT ADLT (ELECTROSURGICAL)
ELECTRODE REM PT RTRN 9FT ADLT (ELECTROSURGICAL) ×1 IMPLANT
GLOVE BIO SURGEON STRL SZ7.5 (GLOVE) ×1 IMPLANT
GLOVE SURG SS PI 7.5 STRL IVOR (GLOVE) ×3 IMPLANT
GOWN STRL REIN XL XLG (GOWN DISPOSABLE) ×1 IMPLANT
GOWN STRL REUS W/TWL XL LVL3 (GOWN DISPOSABLE) ×2 IMPLANT
NEEDLE HYPO 22GX1.5 SAFETY (NEEDLE) ×1 IMPLANT
NS IRRIG 500ML POUR BTL (IV SOLUTION) IMPLANT
PACK CYSTOSCOPY (CUSTOM PROCEDURE TRAY) ×2 IMPLANT
SYR 20CC LL (SYRINGE) ×1 IMPLANT
WATER STERILE IRR 3000ML UROMA (IV SOLUTION) ×2 IMPLANT

## 2013-08-17 NOTE — Anesthesia Procedure Notes (Signed)
Procedure Name: LMA Insertion Date/Time: 08/17/2013 7:43 AM Performed by: Mechele Claude Pre-anesthesia Checklist: Patient identified, Emergency Drugs available, Suction available and Patient being monitored Patient Re-evaluated:Patient Re-evaluated prior to inductionOxygen Delivery Method: Circle System Utilized Preoxygenation: Pre-oxygenation with 100% oxygen Intubation Type: IV induction Ventilation: Mask ventilation without difficulty LMA: LMA inserted LMA Size: 4.0 Number of attempts: 1 Airway Equipment and Method: bite block Placement Confirmation: positive ETCO2 Tube secured with: Tape Dental Injury: Teeth and Oropharynx as per pre-operative assessment

## 2013-08-17 NOTE — Transfer of Care (Signed)
Immediate Anesthesia Transfer of Care Note  Patient: Sherri [name]  Procedure(s) Performed: Procedure(s) (LRB): CYSTOSCOPY/HYDRODISTENSION WITH INSTILATION OF MARCAINE AND PYRIDIUM (N/A)  Patient Location: PACU  Anesthesia Type: General  Level of Consciousness: awake, alert  and oriented  Airway & Oxygen Therapy: Patient Spontanous Breathing and Patient connected to face mask oxygen  Post-op Assessment: Report given to PACU RN and Post -op Vital signs reviewed and stable  Post vital signs: Reviewed and stable  Complications: No apparent anesthesia complications

## 2013-08-17 NOTE — Anesthesia Postprocedure Evaluation (Signed)
  Anesthesia Post-op Note  Patient: Sherri [name]  Procedure(s) Performed: Procedure(s) (LRB): CYSTOSCOPY/HYDRODISTENSION WITH INSTILATION OF MARCAINE AND PYRIDIUM (N/A)  Patient Location: PACU  Anesthesia Type: General  Level of Consciousness: awake and alert   Airway and Oxygen Therapy: Patient Spontanous Breathing  Post-op Pain: mild  Post-op Assessment: Post-op Vital signs reviewed, Patient's Cardiovascular Status Stable, Respiratory Function Stable, Patent Airway and No signs of Nausea or vomiting  Last Vitals:  Filed Vitals:   08/17/13 0930  BP: 118/62  Pulse: 58  Temp:   Resp: 16    Post-op Vital Signs: stable   Complications: No apparent anesthesia complications

## 2013-08-17 NOTE — Anesthesia Preprocedure Evaluation (Signed)
Anesthesia Evaluation  Patient identified by MRN, date of birth, ID band Patient awake    Reviewed: Allergy & Precautions, H&P , NPO status , Patient's Chart, lab work & pertinent test results  Airway Mallampati: II TM Distance: <3 FB Neck ROM: Full    Dental no notable dental hx.    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/   RECURRENT  CANCER  S/P  MODIFIED NECK DISSECTION  02-08-2011   Renal/GU negative Renal ROS  negative genitourinary   Musculoskeletal negative musculoskeletal ROS (+)   Abdominal   Peds negative pediatric ROS (+)  Hematology negative hematology ROS (+)   Anesthesia Other Findings   Reproductive/Obstetrics negative OB ROS                           Anesthesia Physical Anesthesia Plan  ASA: III  Anesthesia Plan: General   Post-op Pain Management:    Induction: Intravenous  Airway Management Planned: LMA  Additional Equipment:   Intra-op Plan:   Post-operative Plan: Extubation in OR  Informed Consent: I have reviewed the patients History and Physical, chart, labs and discussed the procedure including the risks, benefits and alternatives for the proposed anesthesia with the patient or authorized representative who has indicated his/her understanding and acceptance.   Dental advisory given  Plan Discussed with: CRNA and Surgeon  Anesthesia Plan Comments:         Anesthesia Quick Evaluation

## 2013-08-17 NOTE — Discharge Instructions (Signed)
°  Post Anesthesia Home Care Instructions  Activity: Get plenty of rest for the remainder of the day. A responsible adult should stay with you for 24 hours following the procedure.  For the next 24 hours, DO NOT: -Drive a car -Paediatric nurse -Drink alcoholic beverages -Take any medication unless instructed by your physician -Make any legal decisions or sign important papers.  Meals: Start with liquid foods such as gelatin or soup. Progress to regular foods as tolerated. Avoid greasy, spicy, heavy foods. If nausea and/or vomiting occur, drink only clear liquids until the nausea and/or vomiting subsides. Call your physician if vomiting continues.  Special Instructions/Symptoms: Your throat may feel dry or sore from the anesthesia or the breathing tube placed in your throat during surgery. If this causes discomfort, gargle with warm salt water. The discomfort should disappear within 24 hours. Cystoscopy, Care After Refer to this sheet in the next few weeks. These instructions provide you with information on caring for yourself after your procedure. Your caregiver may also give you more specific instructions. Your treatment has been planned according to current medical practices, but problems sometimes occur. Call your caregiver if you have any problems or questions after your procedure. HOME CARE INSTRUCTIONS  Things you can do to ease any discomfort after your procedure include:  Drinking enough water and fluids to keep your urine clear or pale yellow.  Taking a warm bath to relieve any burning feelings. SEEK IMMEDIATE MEDICAL CARE IF:   You have an increase in blood in your urine.  You notice blood clots in your urine.  You have difficulty passing urine.  You have the chills.  You have abdominal pain.  You have a fever or persistent symptoms for more than 2 3 days.  You have a fever and your symptoms suddenly get worse. MAKE SURE YOU:   Understand these instructions.  Will  watch your condition.  Will get help right away if you are not doing well or get worse. Document Released: 10/19/2004 Document Revised: 12/02/2012 Document Reviewed: 09/23/2011 [facility] Patient Information 2014 [place].

## 2013-08-17 NOTE — Interval H&P Note (Signed)
History and Physical Interval Note:  08/17/2013 7:34 AM  Sherri [name]  has presented today for surgery, with the diagnosis of BLADDER PAIN  The various methods of treatment have been discussed with the patient and family. After consideration of risks, benefits and other options for treatment, the patient has consented to  Procedure(s): CYSTOSCOPY/HYDRODISTENSION (N/A) as a surgical intervention .  The patient's history has been reviewed, patient examined, no change in status, stable for surgery.  I have reviewed the patient's chart and labs.  Questions were answered to the patient's satisfaction.  She c/o pelvic discomfort, uncertain on frequency and urgency (husband said he hasn't noticed any),  Constipation, leak with cough, strain. She feels urgency, pain when she holds urine. Relief when she voids.    [name]

## 2013-08-17 NOTE — Op Note (Signed)
Preoperative diagnosis: Pelvic pain, urinary frequency, stress incontinence Postoperative diagnosis: Same  Procedure: Exam under anesthesia Cystoscopy with hydrodistention  Surgeon: Junious Silk  Anesthesia: Rose  Type of anesthesia: Gen.  Findings: On exam under anesthesia, on bimanual exam there were no palpable bladder masses. The bladder was palpably normal. The cervix was palpably normal. The urethra was palpably normal. There were no abdominal masses. The meatus appeared normal.  On cystoscopy the urethra was normal. The trigone and ureteral orifices were in their normal orthotopic position and there was good clear efflux bilaterally. The bladder mucosa appeared normal without erythema or tumor. The bladder contained no stone or foreign body. There were no ulcers.   After hydrodistention there were some mild glomerulations that developed, but no bleeding. Capacity 900 mL.  Description of procedure: After consent was obtained patient brought the operating room. After adequate anesthesia she was placed in lithotomy position and prepped and draped in the usual sterile fashion. An exam under anesthesia was performed. A timeout was performed to confirm the patient and procedure. Cystoscope was advanced per urethra and the bladder examined. The bladder was then filled to capacity low-pressure hydrodistention at 40 cm water pressure. The bladder was drained and the capacity measured at 900 mL. The bladder was distended again and then drained. Photos were taken of the bladder mucosa. A 16 French silicone catheter was inserted and Marcaine/pyridium was instilled. Catheter was then removed. She was awakened taken to recovery room in stable condition.  Complications: None Drains: None Specimens: None Blood loss: None

## 2013-08-18 ENCOUNTER — Encounter (HOSPITAL_BASED_OUTPATIENT_CLINIC_OR_DEPARTMENT_OTHER): Payer: Self-pay | Admitting: Urology

## 2013-08-27 NOTE — Telephone Encounter (Signed)
err

## 2014-02-13 HISTORY — PX: WISDOM TOOTH EXTRACTION: SHX21

## 2014-02-28 ENCOUNTER — Other Ambulatory Visit: Payer: Self-pay | Admitting: Obstetrics and Gynecology

## 2014-03-07 ENCOUNTER — Encounter (HOSPITAL_COMMUNITY)
Admission: RE | Admit: 2014-03-07 | Discharge: 2014-03-07 | Disposition: A | Discharge status: Home / Self Care | Payer: 59 | Source: Ambulatory Visit | Attending: Obstetrics and Gynecology | Admitting: Obstetrics and Gynecology

## 2014-03-07 ENCOUNTER — Encounter (HOSPITAL_COMMUNITY): Payer: Self-pay

## 2014-03-07 ENCOUNTER — Other Ambulatory Visit: Payer: Self-pay

## 2014-03-07 DIAGNOSIS — Z01812 Encounter for preprocedural laboratory examination: Secondary | ICD-10-CM | POA: Diagnosis not present

## 2014-03-07 HISTORY — DX: Fibromyalgia: M79.7

## 2014-03-07 HISTORY — DX: Hyperlipidemia, unspecified: E78.5

## 2014-03-07 HISTORY — DX: Anxiety disorder, unspecified: F41.9

## 2014-03-07 LAB — CBC
HCT: 40.1 % (ref 36.0–46.0)
Hemoglobin: 14.2 g/dL (ref 12.0–15.0)
MCH: 30.2 pg (ref 26.0–34.0)
MCHC: 35.4 g/dL (ref 30.0–36.0)
MCV: 85.3 fL (ref 78.0–100.0)
Platelets: 225 10*3/uL (ref 150–400)
RBC: 4.7 MIL/uL (ref 3.87–5.11)
RDW: 13.6 % (ref 11.5–15.5)
WBC: 11.2 10*3/uL — AB (ref 4.0–10.5)

## 2014-03-07 LAB — BASIC METABOLIC PANEL
Anion gap: 13 (ref 5–15)
BUN: 11 mg/dL (ref 6–23)
CALCIUM: 8.9 mg/dL (ref 8.4–10.5)
CO2: 24 mEq/L (ref 19–32)
Chloride: 101 mEq/L (ref 96–112)
Creatinine, Ser: 0.59 mg/dL (ref 0.50–1.10)
Glucose, Bld: 189 mg/dL — ABNORMAL HIGH (ref 70–99)
Potassium: 4.7 mEq/L (ref 3.7–5.3)
Sodium: 138 mEq/L (ref 137–147)

## 2014-03-07 NOTE — Patient Instructions (Addendum)
   Your procedure is scheduled on: Friday, Nov 27  Enter through the [employer] [facility] at: 6:30 AM Pick up the phone at the desk and dial [phone] and inform us of your arrival.  Please call this number if you have any problems the morning of surgery: [phone]  Remember: Do not eat or drink after midnight: Thursday Take these medicines the morning of surgery with a SIP OF WATER: atenolol, atorvastatin, levothyroxine, zantac, ativan if needed.  Bring albuterol inhaler with you on day of surgery.  Do not wear jewelry, make-up, or FINGER nail polish No metal in your hair or on your body. Do not wear lotions, powders, perfumes.  You may wear deodorant.  Do not bring valuables to the hospital. Contacts, dentures or bridgework may not be worn into surgery.  Leave suitcase in the car. After Surgery it may be brought to your room. For patients being admitted to the hospital, checkout time is 11:00am the day of discharge.  Home with husband Sherri [name] cell [phone]

## 2014-03-10 MED ORDER — SODIUM CHLORIDE 0.9 % IV SOLN
3.0000 g | INTRAVENOUS | Status: AC
  Administered 2014-03-11: 3 g via INTRAVENOUS
  Filled 2014-03-10: qty 3

## 2014-03-10 NOTE — H&P (Signed)
NAMELAKSHMI, Sherri [name]             ACCOUNT NO.:  [id number]  MEDICAL RECORD NO.:  [id number]  LOCATION:  [facility]                         FACILITY:  [facility]  PHYSICIAN:  Lovenia Kim, M.D.DATE OF BIRTH:  1976/01/27  DATE OF ADMISSION:  02/25/2014 DATE OF DISCHARGE:                             HISTORY & PHYSICAL   PREOPERATIVE DIAGNOSIS:  Symptomatic dysmenorrhea, menorrhagia with failed endometrial ablation, and  symptomatic uterine fibroids.  HISTORY OF PRESENT ILLNESS:  She is a 38 year old white female, G2, P3 who presents with symptoms as noted for definitive therapy.  ALLERGIES:  She has allergies to OXYCODONE, CEPHALEXIN, TRAMADOL, CODEINE, and VICODIN TABLETS.  FAMILY HISTORY:  Family history of skin cancer, heart disease and stroke, pneumonia, rheumatoid arthritis, breast cancer, and thyroid dysfunction.  MEDICATIONS:  Include thyroid replacement therapy, Toviaz, Lipitor, chlorpromazine, promethazine, lorazepam, and atenolol.  Medical problems to include history of hypercholesterolemia, tobacco, IBS, GERD.  SOCIAL HISTORY:  Remarkable for intermittent tobacco abuse.  PREGNANCY HISTORY:  Remarkable for C-section x2.  SURGICAL HISTORY:  Sphincterotomy, neck dissection or questionable thyroid cancer, NovaSure ablation, tubal ligation, and C-section as noted.  PHYSICAL EXAMINATION:  GENERAL:  A well-developed, well-nourished white female, in no acute distress. HEENT:  Normal. NECK:  Supple.  Full range of motion. LUNGS:  Clear. HEART:  Regular rate and rhythm. ABDOMEN:  Soft, nontender. PELVIC EXAM:  Reveals anteflexed enlarged uterus and no adnexal masses. EXTREMITIES:  There are no cords. NEUROLOGIC:  Nonfocal. SKIN:  Intact.  IMPRESSION: 1. Symptomatic fibroids. 2. Refractory dysmenorrhea. 3. Refractory menorrhagia.  PLAN:  Proceed with da Vinci assisted total laparoscopic hysterectomy, bilateral salpingectomy.  Risks of anesthesia, infection,  bleeding, injury to surrounding organs, possible need for repair was discussed, delayed versus immediate complications to include bowel and bladder injury are noted.  The patient acknowledges and wishes to proceed.     Lovenia Kim, M.D.     RJT/MEDQ  D:  03/10/2014  T:  03/10/2014  Job:  [id number]

## 2014-03-11 ENCOUNTER — Encounter (HOSPITAL_COMMUNITY): Payer: Self-pay | Admitting: *Deleted

## 2014-03-11 ENCOUNTER — Ambulatory Visit (HOSPITAL_COMMUNITY): Payer: 59 | Admitting: Anesthesiology

## 2014-03-11 ENCOUNTER — Encounter (HOSPITAL_COMMUNITY)
Admission: RE | Disposition: A | Discharge status: Home / Self Care | Payer: Self-pay | Source: Ambulatory Visit | Attending: Obstetrics and Gynecology

## 2014-03-11 ENCOUNTER — Ambulatory Visit (HOSPITAL_COMMUNITY)
Admission: RE | Admit: 2014-03-11 | Discharge: 2014-03-12 | Disposition: A | Discharge status: Home / Self Care | Payer: 59 | Source: Ambulatory Visit | Attending: Obstetrics and Gynecology | Admitting: Obstetrics and Gynecology

## 2014-03-11 DIAGNOSIS — N815 Vaginal enterocele: Secondary | ICD-10-CM | POA: Diagnosis not present

## 2014-03-11 DIAGNOSIS — D259 Leiomyoma of uterus, unspecified: Secondary | ICD-10-CM | POA: Diagnosis not present

## 2014-03-11 DIAGNOSIS — M797 Fibromyalgia: Secondary | ICD-10-CM | POA: Insufficient documentation

## 2014-03-11 DIAGNOSIS — N920 Excessive and frequent menstruation with regular cycle: Secondary | ICD-10-CM | POA: Insufficient documentation

## 2014-03-11 DIAGNOSIS — N736 Female pelvic peritoneal adhesions (postinfective): Secondary | ICD-10-CM | POA: Diagnosis not present

## 2014-03-11 DIAGNOSIS — Z91040 Latex allergy status: Secondary | ICD-10-CM | POA: Insufficient documentation

## 2014-03-11 DIAGNOSIS — E039 Hypothyroidism, unspecified: Secondary | ICD-10-CM | POA: Diagnosis not present

## 2014-03-11 DIAGNOSIS — K219 Gastro-esophageal reflux disease without esophagitis: Secondary | ICD-10-CM | POA: Diagnosis not present

## 2014-03-11 DIAGNOSIS — F17210 Nicotine dependence, cigarettes, uncomplicated: Secondary | ICD-10-CM | POA: Diagnosis not present

## 2014-03-11 DIAGNOSIS — Z880 Allergy status to penicillin: Secondary | ICD-10-CM | POA: Insufficient documentation

## 2014-03-11 DIAGNOSIS — N946 Dysmenorrhea, unspecified: Secondary | ICD-10-CM | POA: Diagnosis present

## 2014-03-11 DIAGNOSIS — Z881 Allergy status to other antibiotic agents status: Secondary | ICD-10-CM | POA: Insufficient documentation

## 2014-03-11 DIAGNOSIS — Z885 Allergy status to narcotic agent status: Secondary | ICD-10-CM | POA: Insufficient documentation

## 2014-03-11 DIAGNOSIS — Z888 Allergy status to other drugs, medicaments and biological substances status: Secondary | ICD-10-CM | POA: Insufficient documentation

## 2014-03-11 DIAGNOSIS — Z90710 Acquired absence of both cervix and uterus: Secondary | ICD-10-CM | POA: Diagnosis not present

## 2014-03-11 DIAGNOSIS — Z6835 Body mass index (BMI) 35.0-35.9, adult: Secondary | ICD-10-CM | POA: Insufficient documentation

## 2014-03-11 HISTORY — PX: ROBOTIC ASSISTED TOTAL HYSTERECTOMY: SHX6085

## 2014-03-11 HISTORY — DX: Acquired absence of both cervix and uterus: Z90.710

## 2014-03-11 HISTORY — PX: BILATERAL SALPINGECTOMY: SHX5743

## 2014-03-11 HISTORY — PX: OMENTECTOMY: SHX5985

## 2014-03-11 LAB — MRSA PCR SCREENING: MRSA by PCR: NEGATIVE

## 2014-03-11 SURGERY — ROBOTIC ASSISTED TOTAL HYSTERECTOMY
Anesthesia: General | Site: Abdomen

## 2014-03-11 MED ORDER — TRAMADOL HCL 50 MG PO TABS
50.0000 mg | ORAL_TABLET | Freq: Four times a day (QID) | ORAL | Status: DC | PRN
  Filled 2014-03-11: qty 1

## 2014-03-11 MED ORDER — GLYCOPYRROLATE 0.2 MG/ML IJ SOLN
INTRAMUSCULAR | Status: AC
  Filled 2014-03-11: qty 4

## 2014-03-11 MED ORDER — SODIUM CHLORIDE 0.9 % IJ SOLN
INTRAMUSCULAR | Status: AC
Start: 2014-03-11 — End: 2014-03-11
  Filled 2014-03-11: qty 50

## 2014-03-11 MED ORDER — LIDOCAINE HCL (CARDIAC) 20 MG/ML IV SOLN
INTRAVENOUS | Status: DC | PRN
  Administered 2014-03-11: 50 mg via INTRAVENOUS

## 2014-03-11 MED ORDER — NALOXONE HCL 0.4 MG/ML IJ SOLN: 0.4000 mg | INTRAMUSCULAR | Status: DC | PRN

## 2014-03-11 MED ORDER — SCOPOLAMINE 1 MG/3DAYS TD PT72
MEDICATED_PATCH | TRANSDERMAL | Status: AC
  Filled 2014-03-11: qty 1

## 2014-03-11 MED ORDER — LACTATED RINGERS IV SOLN
INTRAVENOUS | Status: DC
  Administered 2014-03-11 (×2): via INTRAVENOUS

## 2014-03-11 MED ORDER — PROPOFOL 10 MG/ML IV EMUL
INTRAVENOUS | Status: AC
  Filled 2014-03-11: qty 20

## 2014-03-11 MED ORDER — DIPHENHYDRAMINE HCL 12.5 MG/5ML PO ELIX
12.5000 mg | ORAL_SOLUTION | Freq: Four times a day (QID) | ORAL | Status: DC | PRN
  Administered 2014-03-12: 12.5 mg via ORAL
  Administered 2014-03-12: 25 mg via ORAL
  Administered 2014-03-12: 12.5 mg via ORAL
  Filled 2014-03-11 (×4): qty 5

## 2014-03-11 MED ORDER — SODIUM CHLORIDE 0.9 % IJ SOLN: 9.0000 mL | INTRAMUSCULAR | Status: DC | PRN

## 2014-03-11 MED ORDER — SCOPOLAMINE 1 MG/3DAYS TD PT72
1.0000 | MEDICATED_PATCH | Freq: Once | TRANSDERMAL | Status: DC
  Administered 2014-03-11: 1.5 mg via TRANSDERMAL

## 2014-03-11 MED ORDER — HYDROMORPHONE 0.3 MG/ML IV SOLN
INTRAVENOUS | Status: DC
  Administered 2014-03-11: 0.3 mg via INTRAVENOUS
  Administered 2014-03-11: 3.3 mg via INTRAVENOUS
  Administered 2014-03-11: 2.4 mg via INTRAVENOUS
  Administered 2014-03-11: 13:00:00 via INTRAVENOUS
  Filled 2014-03-11: qty 25

## 2014-03-11 MED ORDER — ROPIVACAINE HCL 5 MG/ML IJ SOLN
INTRAMUSCULAR | Status: AC
  Filled 2014-03-11: qty 60

## 2014-03-11 MED ORDER — DIPHENHYDRAMINE HCL 50 MG/ML IJ SOLN
12.5000 mg | Freq: Four times a day (QID) | INTRAMUSCULAR | Status: DC | PRN
  Administered 2014-03-11: 12.5 mg via INTRAVENOUS
  Administered 2014-03-11: 25 mg via INTRAVENOUS
  Filled 2014-03-11 (×2): qty 1

## 2014-03-11 MED ORDER — FENTANYL CITRATE 0.05 MG/ML IJ SOLN
INTRAMUSCULAR | Status: DC | PRN
  Administered 2014-03-11: 100 ug via INTRAVENOUS
  Administered 2014-03-11: 50 ug via INTRAVENOUS
  Administered 2014-03-11 (×2): 100 ug via INTRAVENOUS
  Administered 2014-03-11 (×2): 50 ug via INTRAVENOUS

## 2014-03-11 MED ORDER — ATORVASTATIN CALCIUM 20 MG PO TABS
20.0000 mg | ORAL_TABLET | Freq: Every day | ORAL | Status: DC
  Administered 2014-03-12: 20 mg via ORAL
  Filled 2014-03-11 (×2): qty 1

## 2014-03-11 MED ORDER — HYDROMORPHONE HCL 1 MG/ML IJ SOLN
INTRAMUSCULAR | Status: DC | PRN
  Administered 2014-03-11: 1 mg via INTRAVENOUS

## 2014-03-11 MED ORDER — DEXAMETHASONE SODIUM PHOSPHATE 4 MG/ML IJ SOLN
INTRAMUSCULAR | Status: AC
  Filled 2014-03-11: qty 1

## 2014-03-11 MED ORDER — PROMETHAZINE HCL 25 MG/ML IJ SOLN: 6.2500 mg | INTRAMUSCULAR | Status: DC | PRN

## 2014-03-11 MED ORDER — DEXAMETHASONE SODIUM PHOSPHATE 10 MG/ML IJ SOLN
INTRAMUSCULAR | Status: DC | PRN
  Administered 2014-03-11: 4 mg via INTRAVENOUS

## 2014-03-11 MED ORDER — MIDAZOLAM HCL 2 MG/2ML IJ SOLN
INTRAMUSCULAR | Status: AC
  Filled 2014-03-11: qty 2

## 2014-03-11 MED ORDER — KETOROLAC TROMETHAMINE 10 MG PO TABS
10.0000 mg | ORAL_TABLET | Freq: Three times a day (TID) | ORAL | Status: DC | PRN
  Administered 2014-03-12: 10 mg via ORAL
  Filled 2014-03-11 (×2): qty 1

## 2014-03-11 MED ORDER — HYDROMORPHONE HCL 1 MG/ML IJ SOLN
INTRAMUSCULAR | Status: AC
  Filled 2014-03-11: qty 1

## 2014-03-11 MED ORDER — LIDOCAINE HCL (CARDIAC) 20 MG/ML IV SOLN
INTRAVENOUS | Status: AC
  Filled 2014-03-11: qty 5

## 2014-03-11 MED ORDER — KETOROLAC TROMETHAMINE 30 MG/ML IJ SOLN
INTRAMUSCULAR | Status: AC
  Filled 2014-03-11: qty 1

## 2014-03-11 MED ORDER — KETOROLAC TROMETHAMINE 30 MG/ML IJ SOLN: 15.0000 mg | Freq: Once | INTRAMUSCULAR | Status: DC | PRN

## 2014-03-11 MED ORDER — ONDANSETRON HCL 4 MG/2ML IJ SOLN
INTRAMUSCULAR | Status: AC
  Filled 2014-03-11: qty 2

## 2014-03-11 MED ORDER — FENTANYL CITRATE 0.05 MG/ML IJ SOLN
INTRAMUSCULAR | Status: AC
  Filled 2014-03-11: qty 5

## 2014-03-11 MED ORDER — KETOROLAC TROMETHAMINE 30 MG/ML IJ SOLN
INTRAMUSCULAR | Status: DC | PRN
  Administered 2014-03-11: 30 mg via INTRAVENOUS

## 2014-03-11 MED ORDER — PROMETHAZINE HCL 25 MG PO TABS: 25.0000 mg | ORAL_TABLET | Freq: Four times a day (QID) | ORAL | Status: DC | PRN

## 2014-03-11 MED ORDER — ROCURONIUM BROMIDE 100 MG/10ML IV SOLN
INTRAVENOUS | Status: AC
  Filled 2014-03-11: qty 1

## 2014-03-11 MED ORDER — ONDANSETRON HCL 4 MG/2ML IJ SOLN: 4.0000 mg | Freq: Four times a day (QID) | INTRAMUSCULAR | Status: DC | PRN

## 2014-03-11 MED ORDER — MEPERIDINE HCL 25 MG/ML IJ SOLN: 6.2500 mg | INTRAMUSCULAR | Status: DC | PRN

## 2014-03-11 MED ORDER — GLYCOPYRROLATE 0.2 MG/ML IJ SOLN
INTRAMUSCULAR | Status: DC | PRN
  Administered 2014-03-11: 0.2 mg via INTRAVENOUS
  Administered 2014-03-11: 0.6 mg via INTRAVENOUS

## 2014-03-11 MED ORDER — DIPHENHYDRAMINE HCL 50 MG/ML IJ SOLN
INTRAMUSCULAR | Status: AC
  Filled 2014-03-11: qty 1

## 2014-03-11 MED ORDER — NEOSTIGMINE METHYLSULFATE 10 MG/10ML IV SOLN
INTRAVENOUS | Status: DC | PRN
  Administered 2014-03-11: 4 mg via INTRAVENOUS

## 2014-03-11 MED ORDER — DIPHENHYDRAMINE HCL 50 MG/ML IJ SOLN
12.5000 mg | Freq: Once | INTRAMUSCULAR | Status: AC
  Administered 2014-03-11: 12.5 mg via INTRAVENOUS

## 2014-03-11 MED ORDER — LEVOTHYROXINE SODIUM 125 MCG PO TABS
250.0000 ug | ORAL_TABLET | Freq: Every day | ORAL | Status: DC
  Administered 2014-03-12: 250 ug via ORAL
  Filled 2014-03-11 (×2): qty 2

## 2014-03-11 MED ORDER — EPHEDRINE 5 MG/ML INJ
INTRAVENOUS | Status: AC
  Filled 2014-03-11: qty 10

## 2014-03-11 MED ORDER — PROPOFOL 10 MG/ML IV BOLUS
INTRAVENOUS | Status: DC | PRN
  Administered 2014-03-11: 200 mg via INTRAVENOUS

## 2014-03-11 MED ORDER — FENTANYL CITRATE 0.05 MG/ML IJ SOLN: 25.0000 ug | INTRAMUSCULAR | Status: DC | PRN

## 2014-03-11 MED ORDER — SODIUM CHLORIDE 0.9 % IJ SOLN
INTRAMUSCULAR | Status: AC
  Filled 2014-03-11: qty 20

## 2014-03-11 MED ORDER — ONDANSETRON HCL 4 MG/2ML IJ SOLN
INTRAMUSCULAR | Status: DC | PRN
  Administered 2014-03-11: 4 mg via INTRAVENOUS

## 2014-03-11 MED ORDER — ROCURONIUM BROMIDE 100 MG/10ML IV SOLN
INTRAVENOUS | Status: DC | PRN
  Administered 2014-03-11: 50 mg via INTRAVENOUS
  Administered 2014-03-11: 20 mg via INTRAVENOUS
  Administered 2014-03-11: 10 mg via INTRAVENOUS

## 2014-03-11 MED ORDER — DEXTROSE IN LACTATED RINGERS 5 % IV SOLN
INTRAVENOUS | Status: DC
  Administered 2014-03-11: 20:00:00 via INTRAVENOUS

## 2014-03-11 MED ORDER — ATENOLOL 50 MG PO TABS
75.0000 mg | ORAL_TABLET | Freq: Every day | ORAL | Status: DC
  Administered 2014-03-12: 75 mg via ORAL
  Filled 2014-03-11 (×2): qty 1

## 2014-03-11 MED ORDER — SODIUM CHLORIDE 0.9 % IV SOLN
INTRAVENOUS | Status: DC | PRN
  Administered 2014-03-11: 76 mL

## 2014-03-11 MED ORDER — NEOSTIGMINE METHYLSULFATE 10 MG/10ML IV SOLN
INTRAVENOUS | Status: AC
  Filled 2014-03-11: qty 1

## 2014-03-11 MED ORDER — HYDROMORPHONE HCL 2 MG PO TABS
2.0000 mg | ORAL_TABLET | ORAL | Status: DC | PRN
  Administered 2014-03-11 – 2014-03-12 (×3): 2 mg via ORAL
  Filled 2014-03-11 (×3): qty 1

## 2014-03-11 MED ORDER — EPHEDRINE SULFATE 50 MG/ML IJ SOLN
INTRAMUSCULAR | Status: DC | PRN
  Administered 2014-03-11: 10 mg via INTRAVENOUS

## 2014-03-11 MED ORDER — MIDAZOLAM HCL 2 MG/2ML IJ SOLN
INTRAMUSCULAR | Status: DC | PRN
  Administered 2014-03-11: 2 mg via INTRAVENOUS

## 2014-03-11 MED ORDER — ARTIFICIAL TEARS OP OINT
TOPICAL_OINTMENT | OPHTHALMIC | Status: AC
  Filled 2014-03-11: qty 3.5

## 2014-03-11 SURGICAL SUPPLY — 57 items
BARRIER ADHS 3X4 INTERCEED (GAUZE/BANDAGES/DRESSINGS) ×3 IMPLANT
BRR ADH 4X3 ABS CNTRL BYND (GAUZE/BANDAGES/DRESSINGS) ×2
CABLE HIGH FREQUENCY MONO STRZ (ELECTRODE) ×1 IMPLANT
CATH FOLEY 3WAY  5CC 16FR (CATHETERS) ×1
CATH FOLEY 3WAY 5CC 16FR (CATHETERS) ×2 IMPLANT
CLOTH BEACON ORANGE TIMEOUT ST (SAFETY) ×3 IMPLANT
CONT PATH 16OZ SNAP LID 3702 (MISCELLANEOUS) ×3 IMPLANT
COVER BACK TABLE 60X90IN (DRAPES) ×6 IMPLANT
COVER TIP SHEARS 8 DVNC (MISCELLANEOUS) ×2 IMPLANT
COVER TIP SHEARS 8MM DA VINCI (MISCELLANEOUS) ×1
DECANTER SPIKE VIAL GLASS SM (MISCELLANEOUS) ×12 IMPLANT
DRAPE HUG U DISPOSABLE (DRAPE) ×3 IMPLANT
DRAPE WARM FLUID 44X44 (DRAPE) ×3 IMPLANT
DRSG COVADERM PLUS 2X2 (GAUZE/BANDAGES/DRESSINGS) ×1 IMPLANT
DRSG OPSITE POSTOP 3X4 (GAUZE/BANDAGES/DRESSINGS) ×1 IMPLANT
DURAPREP 26ML APPLICATOR (WOUND CARE) ×3 IMPLANT
ELECT REM PT RETURN 9FT ADLT (ELECTROSURGICAL) ×3
ELECTRODE REM PT RTRN 9FT ADLT (ELECTROSURGICAL) ×2 IMPLANT
EVACUATOR SMOKE 8.L (FILTER) ×4 IMPLANT
GAUZE VASELINE 3X9 (GAUZE/BANDAGES/DRESSINGS) IMPLANT
GLOVE BIO SURGEON STRL SZ7 (GLOVE) ×1 IMPLANT
GLOVE BIO SURGEON STRL SZ7.5 (GLOVE) ×6 IMPLANT
GLOVE BIOGEL PI IND STRL 7.0 (GLOVE) ×4 IMPLANT
GLOVE BIOGEL PI INDICATOR 7.0 (GLOVE) ×3
GOWN STRL REUS W/TWL LRG LVL3 (GOWN DISPOSABLE) ×30 IMPLANT
KIT ACCESSORY DA VINCI DISP (KITS) ×1
KIT ACCESSORY DVNC DISP (KITS) ×2 IMPLANT
LEGGING LITHOTOMY PAIR STRL (DRAPES) ×3 IMPLANT
LIQUID BAND (GAUZE/BANDAGES/DRESSINGS) ×3 IMPLANT
NEEDLE INSUFFLATION 150MM (ENDOMECHANICALS) ×4 IMPLANT
OCCLUDER COLPOPNEUMO (BALLOONS) IMPLANT
PACK ROBOT WH (CUSTOM PROCEDURE TRAY) ×3 IMPLANT
PAD PREP 24X48 CUFFED NSTRL (MISCELLANEOUS) ×6 IMPLANT
PAD TRENDELENBURG POSITION (MISCELLANEOUS) ×3 IMPLANT
PROTECTOR NERVE ULNAR (MISCELLANEOUS) ×6 IMPLANT
SCISSORS LAP 5X35 DISP (ENDOMECHANICALS) ×1 IMPLANT
SET CYSTO W/LG BORE CLAMP LF (SET/KITS/TRAYS/PACK) ×1 IMPLANT
SET IRRIG TUBING LAPAROSCOPIC (IRRIGATION / IRRIGATOR) ×3 IMPLANT
SUT VIC AB 0 CT1 27 (SUTURE) ×6
SUT VIC AB 0 CT1 27XBRD ANBCTR (SUTURE) ×4 IMPLANT
SUT VICRYL 0 UR6 27IN ABS (SUTURE) ×3 IMPLANT
SUT VICRYL RAPIDE 4/0 PS 2 (SUTURE) ×6 IMPLANT
SUT VLOC 180 0 9IN  GS21 (SUTURE) ×1
SUT VLOC 180 0 9IN GS21 (SUTURE) IMPLANT
SYR 50ML LL SCALE MARK (SYRINGE) ×3 IMPLANT
SYRINGE 10CC LL (SYRINGE) ×3 IMPLANT
TIP RUMI ORANGE 6.7MMX12CM (TIP) IMPLANT
TIP UTERINE 6.7X8CM BLUE DISP (MISCELLANEOUS) ×1 IMPLANT
TOWEL OR 17X24 6PK STRL BLUE (TOWEL DISPOSABLE) ×9 IMPLANT
TROCAR DISP BLADELESS 8 DVNC (TROCAR) ×2 IMPLANT
TROCAR DISP BLADELESS 8MM (TROCAR) ×1
TROCAR XCEL 12X100 BLDLESS (ENDOMECHANICALS) IMPLANT
TROCAR XCEL NON-BLD 5MMX100MML (ENDOMECHANICALS) ×3 IMPLANT
TROCAR Z-THREAD 12X150 (TROCAR) ×3 IMPLANT
WARMER LAPAROSCOPE (MISCELLANEOUS) ×3 IMPLANT
WATER STERILE IRR 1000ML POUR (IV SOLUTION) ×9 IMPLANT
WATER STERILE IRR 1000ML UROMA (IV SOLUTION) ×1 IMPLANT

## 2014-03-11 NOTE — Anesthesia Postprocedure Evaluation (Signed)
Anesthesia Post Note  Patient: Sherri [name]  Procedure(s) Performed: Procedure(s) (LRB): ROBOTIC ASSISTED TOTAL HYSTERECTOMY (N/A) BILATERAL PARTIAL  SALPINGECTOMY (Bilateral) OMENTECTOMY (N/A)  Anesthesia type: General  Patient location: PACU  Post pain: Pain level controlled  Post assessment: Post-op Vital signs reviewed  Last Vitals:  Filed Vitals:   03/11/14 1215  BP: 93/51  Pulse: 59  Temp: 36.9 C  Resp: 14    Post vital signs: Reviewed  Level of consciousness: sedated  Complications: No apparent anesthesia complications

## 2014-03-11 NOTE — Anesthesia Procedure Notes (Signed)
Procedure Name: Intubation Date/Time: 03/11/2014 8:30 AM Performed by: Jonna Munro Pre-anesthesia Checklist: Patient identified, Emergency Drugs available, Suction available, Patient being monitored and Timeout performed Patient Re-evaluated:Patient Re-evaluated prior to inductionOxygen Delivery Method: Circle system utilized Preoxygenation: Pre-oxygenation with 100% oxygen Intubation Type: IV induction Ventilation: Mask ventilation without difficulty Laryngoscope Size: Mac and 3 Grade View: Grade III Tube type: Oral Tube size: 7.0 mm Number of attempts: 2 Airway Equipment and Method: Video-laryngoscopy and Stylet Placement Confirmation: ETT inserted through vocal cords under direct vision,  positive ETCO2 and breath sounds checked- equal and bilateral Secured at: 21 cm Tube secured with: Tape Dental Injury: Teeth and Oropharynx as per pre-operative assessment  Comments: DL x 1with MAC scope by CRNA, unable to visualize cords, DL x 1 with MAC scope by Anesthesiologist, unable to see cords. DL with glidescope by Anesthesiologist, cords visible, ETT inserted easily, =BBS, chest rise noted bilat. Easy, atraumatic intubation, no trauma to teeth and oropharynx noted.

## 2014-03-11 NOTE — Addendum Note (Signed)
Addendum  created 03/11/14 1720 by Alvy Bimler, CRNA   Modules edited: Notes Section   Notes Section:  File: [id number]

## 2014-03-11 NOTE — Plan of Care (Signed)
Problem: Consults Goal: Nutrition Consult-if indicated Outcome: Not Applicable Date Met:  58/59/29 Goal: Diabetes Guidelines if Diabetic/Glucose > 140 If diabetic or lab glucose is > 140 mg/dl - Initiate Diabetes/Hyperglycemia Guidelines & Document Interventions  Outcome: Not Applicable Date Met:  24/46/28

## 2014-03-11 NOTE — Anesthesia Preprocedure Evaluation (Addendum)
Anesthesia Evaluation  Patient identified by MRN, date of birth, ID band Patient awake    Reviewed: Allergy & Precautions, H&P , NPO status , Patient's Chart, lab work & pertinent test results, reviewed documented beta blocker date and time   History of Anesthesia Complications (+) Emergence Delirium and history of anesthetic complications  Airway Mallampati: III       Dental  (+) Teeth Intact   Pulmonary sleep apnea , Current Smoker,  Multiple pulmonary nodlues - being followed by pulmonologist.  Per pulm notes, these nodules are not mediastinal.  The patient denies SOB, dysphagia or other symptoms of mediastinal mass. Smoker x 19 years - rhonchi (resolved with deep breathing and coughing)    - rales    Cardiovascular Exercise Tolerance: Good     Neuro/Psych  Headaches (migraines - 2-3 x/month, occipital neuralgia - 3-4 x/week), PSYCHIATRIC DISORDERS Anxiety    GI/Hepatic Neg liver ROS, GERD-  Medicated,IBS   Endo/Other  Hypothyroidism (s/p thyroidectomy and radiation treatment for thyroid cancer) Morbid obesity  Renal/GU negative Renal ROS     Musculoskeletal  (+) Fibromyalgia -  Abdominal Normal abdominal exam  (+)   Peds  Hematology negative hematology ROS (+)   Anesthesia Other Findings See extensive allergy list  Reproductive/Obstetrics negative OB ROS                           Anesthesia Physical  Anesthesia Plan  ASA: III  Anesthesia Plan: General ETT   Post-op Pain Management:    Induction: Intravenous  Airway Management Planned: Oral ETT  Additional Equipment:   Intra-op Plan:   Post-operative Plan: Extubation in OR  Informed Consent: I have reviewed the patients History and Physical, chart, labs and discussed the procedure including the risks, benefits and alternatives for the proposed anesthesia with the patient or authorized representative who has indicated his/her  understanding and acceptance.   Dental Advisory Given  Plan Discussed with: Surgeon and CRNA  Anesthesia Plan Comments:        Anesthesia Quick Evaluation

## 2014-03-11 NOTE — Plan of Care (Signed)
Problem: Phase I Progression Outcomes Goal: Pain controlled with appropriate interventions Outcome: Completed/Met Date Met:  03/11/14 Goal: Admission history reviewed Outcome: Completed/Met Date Met:  03/11/14 Goal: Dangle/OOB as tolerated per MD order Outcome: Completed/Met Date Met:  03/11/14 Goal: VS, stable, temp < 100.4 degrees F Outcome: Completed/Met Date Met:  03/11/14 Goal: I & O every 4 hrs or as ordered Outcome: Completed/Met Date Met:  03/11/14 Goal: IS, TCDB as ordered Outcome: Completed/Met Date Met:  03/11/14 Goal: Other Phase I Outcomes/Goals Outcome: Completed/Met Date Met:  03/11/14  Problem: Phase II Progression Outcomes Goal: Afebrile, VS remain stable Outcome: Completed/Met Date Met:  03/11/14 Goal: Foley discontinued Outcome: Completed/Met Date Met:  03/11/14

## 2014-03-11 NOTE — Op Note (Signed)
NAMEBURNETTE, VALENTI             ACCOUNT NO.:  [id number]  MEDICAL RECORD NO.:  [id number]  LOCATION:  [facility]                          FACILITY:  [facility]  PHYSICIAN:  Lovenia Kim, M.D.DATE OF BIRTH:  1975/05/22  DATE OF PROCEDURE: DATE OF DISCHARGE:                              OPERATIVE REPORT   PREOPERATIVE DIAGNOSES:  Failed endometrial ablation, menorrhagia, and dysmenorrhea.  POSTOPERATIVE DIAGNOSES:  Failed endometrial ablation, menorrhagia, and dysmenorrhea, dense pelvic adhesions, parasitic fibroid, and enterocele.  PROCEDURE:  Robotically assisted total laparoscopic hysterectomy, bilateral salpingectomy, partial omentectomy, lysis of adhesions McCall culdoplasty.  SURGEON:  Lovenia Kim, MD  ASSISTANTS:  Vilma Prader, MD and Laqueta Carina, CNM  ESTIMATED BLOOD LOSS:  Less than 50 mL.  COMPLICATIONS:  None.  DRAINS:  Foley.  COUNTS:  Correct.  DISPOSITION:  The patient to recovery in good condition.  BRIEF OPERATIVE NOTE:  After being apprised of risks of anesthesia, infection, bleeding, injury to surrounding organs, possible need for repair, delayed versus immediate complications to include bowel and bladder injury, possible need for repair, the patient was brought to the operating room where she was administered a general anesthetic without complications.  Prepped and draped in usual sterile fashion.  Foley catheter placed.  Exam under anesthesia revealed a mid position uterus with no adnexal masses at this time due to the ablative effect to her uterus.  The cervix is stenotic.  This opening was extended using Metzenbaum scissors and RUMI retractor was placed without difficulty. The cup was tied to the specimen in an anterior-posterior fashion.  At this time, attention was turned to the abdominal portion of the procedure whereby infraumbilical incision made with a scalpel.  Veress needle placed opening pressure of 2.  A 5 L of CO2 was  insufflated without difficulty.  Atraumatic trocar entry visualized.  There was a drape of omentum to the anterior abdominal wall all the way down to the level of the uterus, which was noted in the midline.  There was no evidence of bowel injury.  There was atraumatic trocar entry.  Normal liver, gallbladder, but normal appendiceal area.  At this time, 2 accessory ports were placed on the right and the left under direct visualization and Endo Shears were used to completely detach this drape of omentum to the anterior abdominal wall.  At this time, the pelvis was then clearly seen and was noted to have a partial tubal ligation at the time of her C-section.  There were anterior cul-de-sac adhesions. The omentum was completely draped and parasitized by an exophytic subserosal slightly pedunculated fibroid, and in addition there was evidence of omental adhesions throughout the cul-de-sac attaching to the right ovary along the right cul-de-sac and along the left.  So at this time, the left mesosalpinx was cauterized detaching the left tubal segment.  The retroperitoneal space was entered.  The ureter was identified, and the tubo-ovarian ligament is detached.  The round ligament was divided on the left, and the left uterine vessels skeletonized.  At this time, due to also omental adhesions at the anterior cul-de-sac, these are lysed and the bladder flap was developed sharply due to extensive scarring, but is done in a sharp  and blunt dissection fashion, exposing the bladder reflection without difficulty.  The left vessels were further skeletonized, uterine vessel cauterized and divided.  At this time, the omental adhesions which were extensively noted into the cul-de-sac are sharply and bluntly dissected off the peritoneum of the cul-de-sac. They were such adhesions that there was a long dense adhesion to the rest of the omentum, therefore a decision was made to perform partial omentectomy  __________ blood supply was identified, and the omentum was cut between the area that is exposed in the upper abdomen and the part that was adhesed in the cul-de-sac.  This allowed the further omentectomy to take place in the cul-de-sac so that this can be sharply excised and put aside as a specimen into the anterior cul-de-sac.  The right mesosalpinx was then divided and partial tubal segment divided, the retroperitoneal space was entered.  The ureter was identified.  The tubo-ovarian ligament was cauterized and cut.  The round ligament was cauterized and cut.  The bladder flap was then further developed exposing a nice bladder reflection.  The right uterine vessels were exposed, skeletonized, cauterized, and divided.  At this time, the bladder flap was further sharply developed off the lower uterine segment and off the RUMI cup.  The specimen was then detached circumferentially at the cervicovaginal junction and retracted into the vagina without difficulty.  The enterocele was identified.  The cervix, all specimens, two tubal segments and omentum were removed vaginally as well.  The vagina was then closed using 0 V-Loc suture in a continuous running fashion.  A second imbricating layer was placed. McCall culdoplasty suture was placed without difficulty.  Good hemostasis noted.  Ureters were peristalsing normally and bilaterally.  The urine was clear and copious.  At this time, the procedure was terminated, where the robot was undocked.  The CO2 was released.  All trocars were removed under direct visualization.  The incisions were closed using 0 Vicryl, 4-0 Vicryl, and Dermabond.  Vaginal exam revealed the vaginal cuff to be well approximated and no evidence of defects noted.  The patient was transferred to recovery in good condition.     Lovenia Kim, M.D.     RJT/MEDQ  D:  03/11/2014  T:  03/11/2014  Job:  [id number]

## 2014-03-11 NOTE — Transfer of Care (Signed)
Immediate Anesthesia Transfer of Care Note  Patient: Sherri [name]  Procedure(s) Performed: Procedure(s): ROBOTIC ASSISTED TOTAL HYSTERECTOMY (N/A) BILATERAL PARTIAL  SALPINGECTOMY (Bilateral) OMENTECTOMY (N/A)  Patient Location: PACU  Anesthesia Type:General  Level of Consciousness: awake, alert  and oriented  Airway & Oxygen Therapy: Patient Spontanous Breathing and Patient connected to nasal cannula oxygen  Post-op Assessment: Report given to PACU RN and Post -op Vital signs reviewed and [name]  Post vital signs: Reviewed and [name]  Complications: No apparent anesthesia complications

## 2014-03-11 NOTE — Progress Notes (Signed)
Patient ID: ITA FRITZSCHE, female   DOB: 11-Dec-1975, 38 y.o.   MRN: [id number] Patient seen and examined. Consent witnessed and signed. No changes noted. Update completed.

## 2014-03-11 NOTE — Anesthesia Postprocedure Evaluation (Signed)
  Anesthesia Post-op Note  Patient: Sherri [name]  Procedure(s) Performed: Procedure(s): ROBOTIC ASSISTED TOTAL HYSTERECTOMY (N/A) BILATERAL PARTIAL  SALPINGECTOMY (Bilateral) OMENTECTOMY (N/A)  Patient Location: PACU and Women's Unit  Anesthesia Type:General  Level of Consciousness: awake, alert , oriented and patient cooperative  Airway and Oxygen Therapy: Patient Spontanous Breathing  Post-op Pain: none  Post-op Assessment: Post-op Vital signs reviewed, Patient's Cardiovascular Status Stable, Respiratory Function Stable, Patent Airway, No signs of Nausea or vomiting, Adequate PO intake, Pain level controlled, No headache, No backache, No residual numbness and No residual motor weakness  Post-op Vital Signs: Reviewed and stable  Last Vitals:  Filed Vitals:   03/11/14 1545  BP: 101/56  Pulse: 75  Temp: 37 C  Resp: 17    Complications: No apparent anesthesia complications

## 2014-03-11 NOTE — Op Note (Signed)
03/11/2014  11:01 AM  PATIENT:  Sherri [name]  38 y.o. female  PRE-OPERATIVE DIAGNOSIS:  Failed Ablation Menorrhagia  Pelvic pain  POST-OPERATIVE DIAGNOSIS:  Failed Ablation Parasitic uterine fibroid Pelvic adhesions Enterocele  PROCEDURE:  Procedure(s): ROBOTIC ASSISTED TOTAL HYSTERECTOMY BILATERAL SALPINGECTOMY PARTIAL OMENTECTOMY LYSIS OF ADHESIONS MCCALL CUL DE PLASTY  SURGEON:  Surgeon(s): Lovenia Kim, MD Elveria Royals, MD  ASSISTANTS: MODY MD AND DAWSON , CNM   ANESTHESIA:   local and general  ESTIMATED BLOOD LOSS: MINIMAL  DRAINS: Urinary Catheter (Foley)   LOCAL MEDICATIONS USED:  MARCAINE    and Amount: 10 ml  SPECIMEN:  Source of Specimen:  UTERUS, CERVIX , TUBAL FRAGMENTS, OMENTUM  DISPOSITION OF SPECIMEN:  PATHOLOGY  COUNTS:  YES  DICTATION #: [id number]  PLAN OF CARE: 23 HR OBSERVATION  PATIENT DISPOSITION:  PACU - hemodynamically stable.

## 2014-03-12 DIAGNOSIS — N920 Excessive and frequent menstruation with regular cycle: Secondary | ICD-10-CM | POA: Diagnosis not present

## 2014-03-12 LAB — BASIC METABOLIC PANEL
ANION GAP: 12 (ref 5–15)
BUN: 9 mg/dL (ref 6–23)
CALCIUM: 8.8 mg/dL (ref 8.4–10.5)
CO2: 27 mEq/L (ref 19–32)
CREATININE: 0.63 mg/dL (ref 0.50–1.10)
Chloride: 104 mEq/L (ref 96–112)
Glucose, Bld: 132 mg/dL — ABNORMAL HIGH (ref 70–99)
Potassium: 4.5 mEq/L (ref 3.7–5.3)
Sodium: 143 mEq/L (ref 137–147)

## 2014-03-12 LAB — CBC
HCT: 34.7 % — ABNORMAL LOW (ref 36.0–46.0)
Hemoglobin: 11.5 g/dL — ABNORMAL LOW (ref 12.0–15.0)
MCH: 28.5 pg (ref 26.0–34.0)
MCHC: 33.1 g/dL (ref 30.0–36.0)
MCV: 85.9 fL (ref 78.0–100.0)
PLATELETS: 182 10*3/uL (ref 150–400)
RBC: 4.04 MIL/uL (ref 3.87–5.11)
RDW: 14 % (ref 11.5–15.5)
WBC: 14.8 10*3/uL — ABNORMAL HIGH (ref 4.0–10.5)

## 2014-03-12 MED ORDER — HYDROMORPHONE HCL 2 MG PO TABS: 2.0000 mg | ORAL_TABLET | ORAL | Status: DC | PRN

## 2014-03-12 NOTE — Progress Notes (Addendum)
Discharge instructions provided to patient and significant other at bedside.  Activity, medications, incision care, follow up appointments, when to call the doctor and community resources discussed.  No questions at this time.  Patient left unit in stable condition with all personal belongings and prescriptions accompanied by staff.  K. Venetta Knee, RN------------------------  

## 2014-03-12 NOTE — Progress Notes (Signed)
1 Day Post-Op Procedure(s) (LRB): ROBOTIC ASSISTED TOTAL HYSTERECTOMY (N/A) BILATERAL PARTIAL  SALPINGECTOMY (Bilateral) OMENTECTOMY (N/A)  Subjective: Patient reports nausea, incisional pain, tolerating PO and + flatus.   Urinary retention noted. No pain.  Objective: I have reviewed patient's vital signs, intake and output and labs. BP 112/62 mmHg  Pulse 65  Temp(Src) 98.8 F (37.1 C) (Oral)  Resp 18  Ht 5\' 4"  (1.626 m)  Wt 93.895 kg (207 lb)  BMI 35.51 kg/m2  SpO2 97%  LMP  (LMP Unknown)  CBC    Component Value Date/Time   WBC 14.8* 03/12/2014 0500   WBC 12.9* 07/15/2013 1313   RBC 4.04 03/12/2014 0500   RBC 5.17 07/15/2013 1313   HGB 11.5* 03/12/2014 0500   HGB 14.4 07/15/2013 1313   HCT 34.7* 03/12/2014 0500   HCT 44.2 07/15/2013 1313   PLT 182 03/12/2014 0500   MCV 85.9 03/12/2014 0500   MCV 85.5 07/15/2013 1313   MCH 28.5 03/12/2014 0500   MCH 27.9 07/15/2013 1313   MCHC 33.1 03/12/2014 0500   MCHC 32.6 07/15/2013 1313   RDW 14.0 03/12/2014 0500   LYMPHSABS 3.3 11/07/2010 0900   MONOABS 0.7 11/07/2010 0900   EOSABS 0.3 11/07/2010 0900   BASOSABS 0.0 11/07/2010 0900      General: alert, cooperative and appears stated age Resp: clear to auscultation bilaterally and normal percussion bilaterally Cardio: regular rate and rhythm, S1, S2 normal, no murmur, click, rub or gallop and normal apical impulse GI: soft, non-tender; bowel sounds normal; no masses,  no organomegaly, normal findings: aorta normal and bowel sounds normal and incision: clean, dry and intact Extremities: extremities normal, atraumatic, no cyanosis or edema and Homans sign is negative, no sign of DVT Vaginal Bleeding: minimal  Assessment: s/p Procedure(s): ROBOTIC ASSISTED TOTAL HYSTERECTOMY (N/A) BILATERAL PARTIAL  SALPINGECTOMY (Bilateral) OMENTECTOMY (N/A): stable, progressing well, tolerating diet and urinary retention  Plan: Options for urinary retention discussed Advance  diet Encourage ambulation Advance to PO medication Discontinue IV fluids Discharge home  LOS: 1 day    Shaeleigh Graw J 03/12/2014, 9:29 AM

## 2014-03-12 NOTE — Progress Notes (Signed)
Patient complaining of bladder pain, urinary urgency. Pain not relieved by voiding, rates pain 7/10. This RN able to palpate distended bladder. Bladder scanner read 460ml residual. In and out cath performed using sterile technique. 774ml clear yellow urine obtained. Patient tolerated procedure well. Pain relieved post cath.

## 2014-03-14 ENCOUNTER — Encounter (HOSPITAL_COMMUNITY): Payer: Self-pay | Admitting: Obstetrics and Gynecology

## 2016-01-08 ENCOUNTER — Other Ambulatory Visit: Payer: Self-pay | Admitting: Obstetrics and Gynecology

## 2016-01-08 DIAGNOSIS — R1030 Lower abdominal pain, unspecified: Secondary | ICD-10-CM

## 2016-01-12 ENCOUNTER — Other Ambulatory Visit: Payer: Self-pay

## 2016-01-25 ENCOUNTER — Encounter: Payer: Self-pay | Admitting: Radiology

## 2016-01-25 ENCOUNTER — Ambulatory Visit
Admission: RE | Admit: 2016-01-25 | Discharge: 2016-01-25 | Disposition: A | Discharge status: Home / Self Care | Payer: 59 | Source: Ambulatory Visit | Attending: Obstetrics and Gynecology | Admitting: Obstetrics and Gynecology

## 2016-01-25 DIAGNOSIS — R109 Unspecified abdominal pain: Secondary | ICD-10-CM

## 2016-01-25 MED ORDER — IOPAMIDOL (ISOVUE-300) INJECTION 61%
125.0000 mL | Freq: Once | INTRAVENOUS | Status: AC | PRN
  Administered 2016-01-25: 125 mL via INTRAVENOUS

## 2017-01-20 IMAGING — CT CT ABD-PELV W/ CM
3 of 5 series · 12 of 36 positions shown, 18 images · IV contrast (READICAT/WATER & [ID] ISOVUE 300)
Comparison: No priors.

CLINICAL DATA: 40-year-old female with history of lower abdominal
and pelvic pain for the past 2 years. Remote history of thyroid
cancer, status post thyroidectomy, with recurrence at the end of
0400.

EXAM:
CT ABDOMEN AND PELVIS WITH CONTRAST
TECHNIQUE: Multidetector CT imaging of the abdomen and pelvis was performed
using the standard protocol following bolus administration of
intravenous contrast.
CONTRAST:  125mL [id number]-THH IOPAMIDOL ([id number]-[id number]) INJECTION 61%

[Series 3: abd/pelvis with · axial · 0.87mm/px · z∈[-394,-64]mm · 7 of 89 slices shown, 12 images]
[im 12/89  soft-tissue]
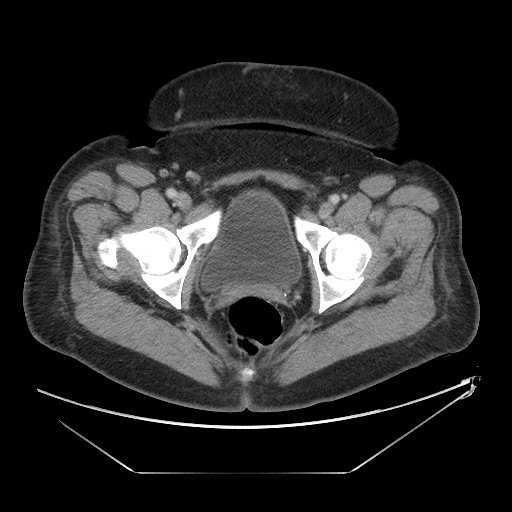
[im 12/89  bone]
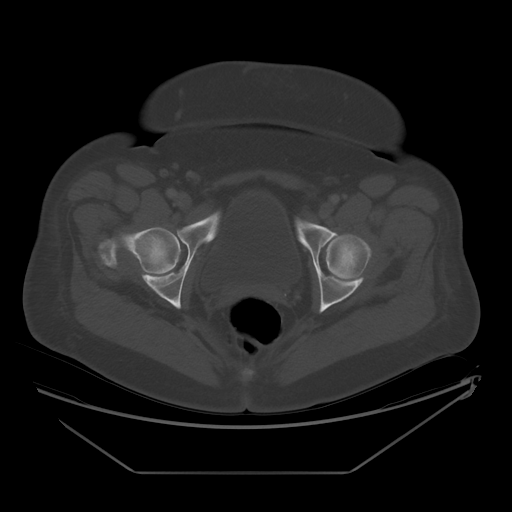
[im 23/89  soft-tissue]
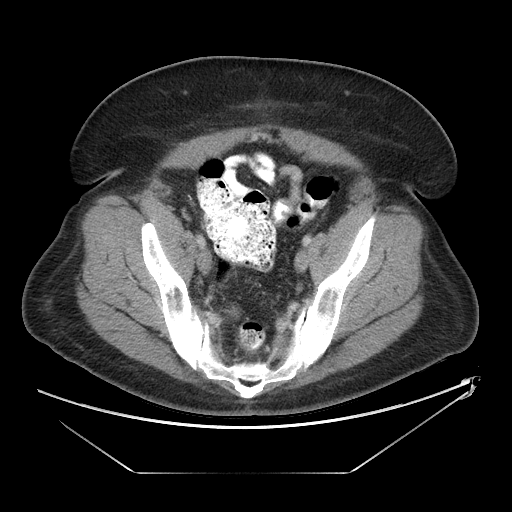
[im 34/89  soft-tissue]
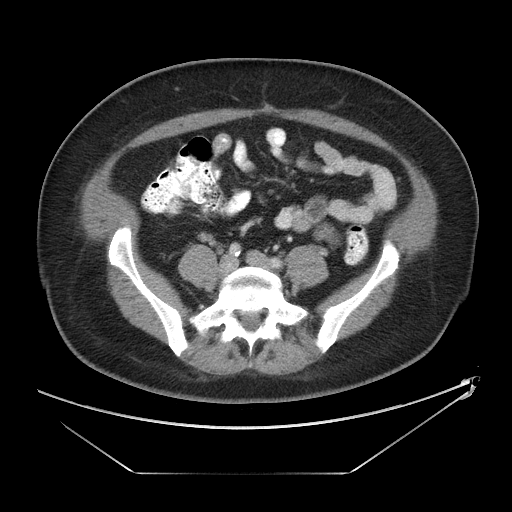
[im 45/89  soft-tissue]
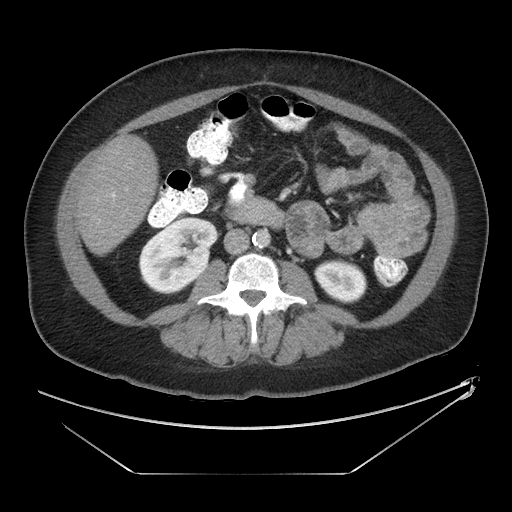
[im 45/89  lung]
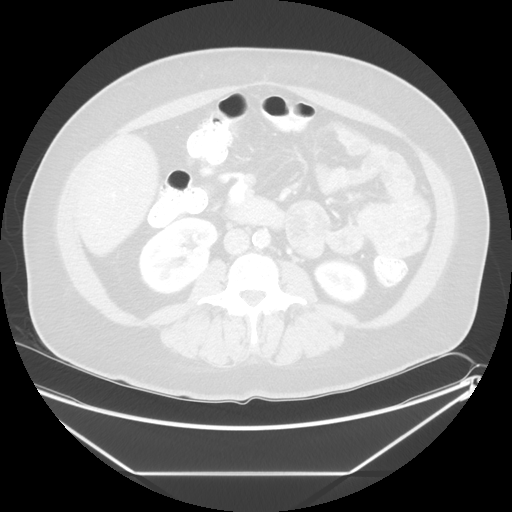
[im 56/89  soft-tissue]
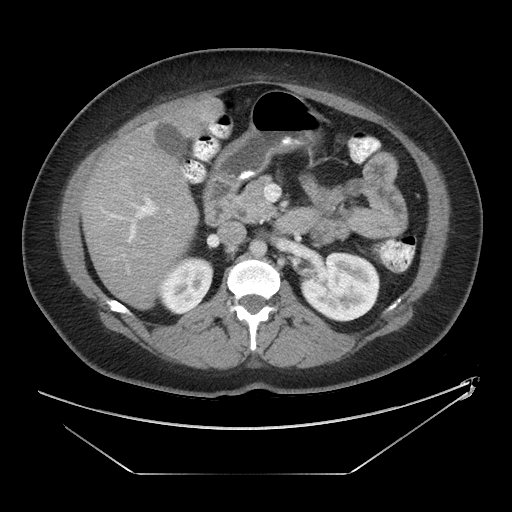
[im 56/89  lung]
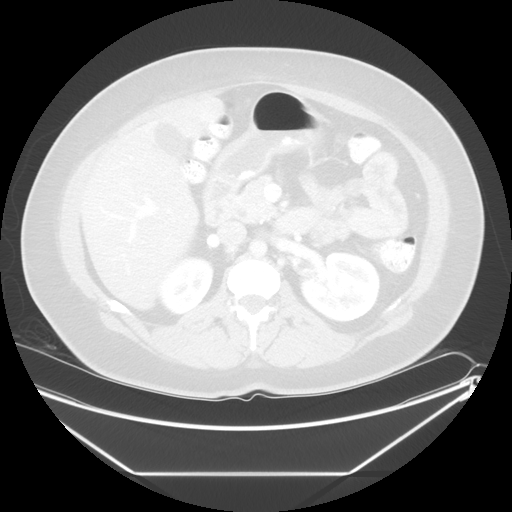
[im 67/89  soft-tissue]
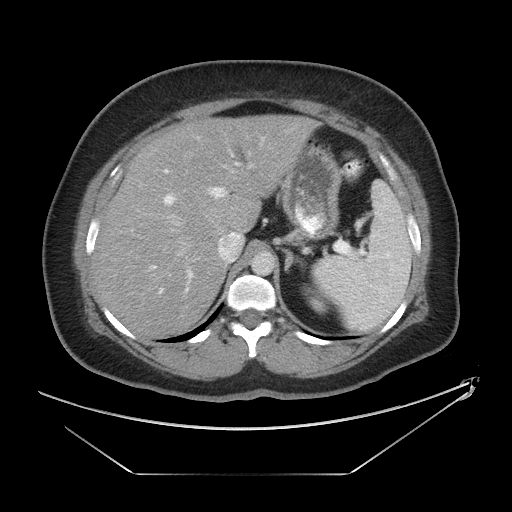
[im 67/89  lung]
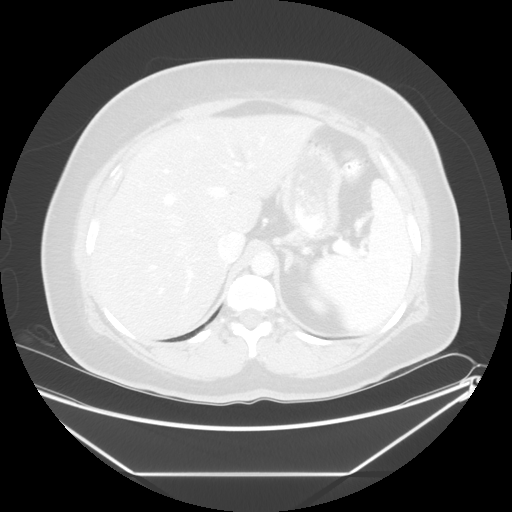
[im 78/89  soft-tissue]
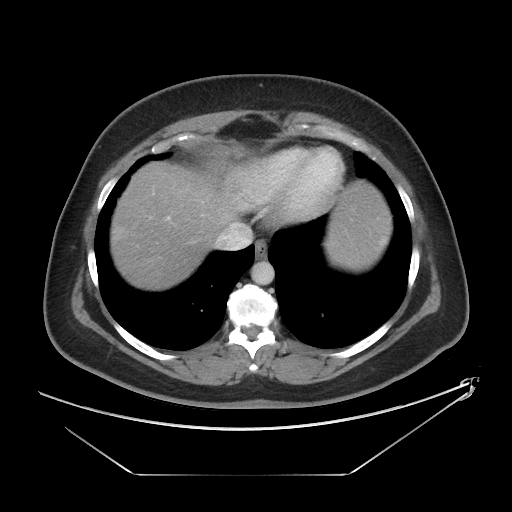
[im 78/89  lung]
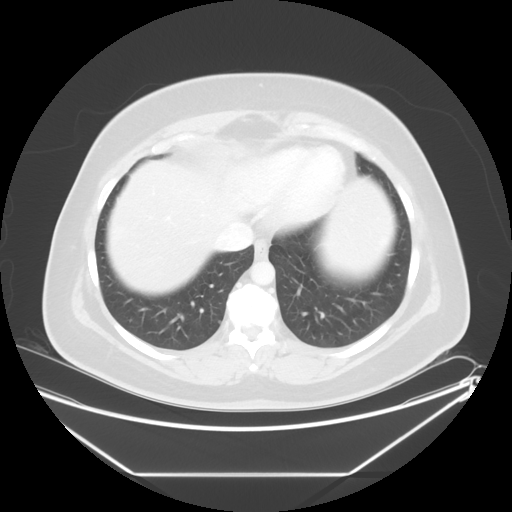

[Series 601: coronal body · coronal · 0.91mm/px · 1 of 132 slices shown, 2 images]
[im 44/132  soft-tissue]
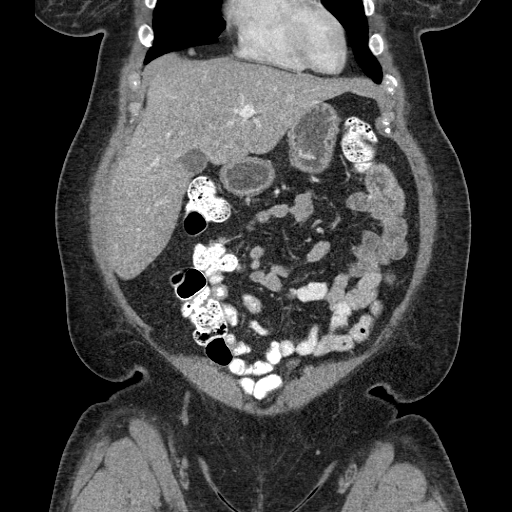
[im 44/132  bone]
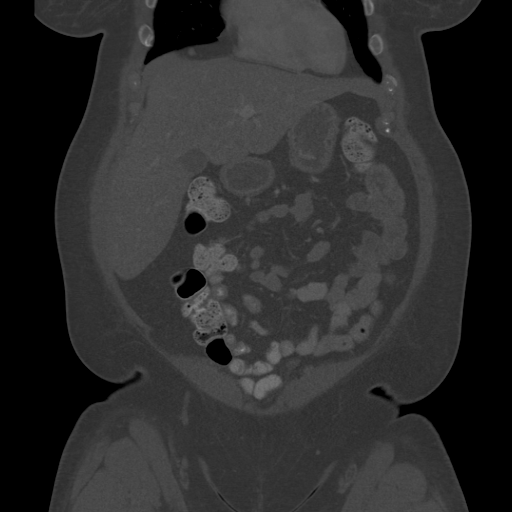

[Series 602: sagittal body · sagittal · 0.91mm/px · 4 of 170 slices shown]
[im 11/170  soft-tissue]
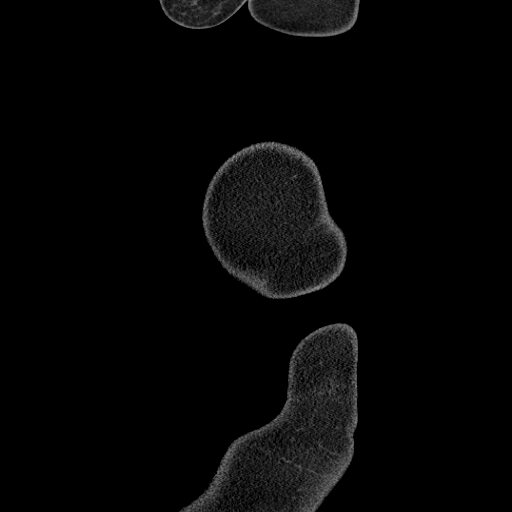
[im 32/170  soft-tissue]
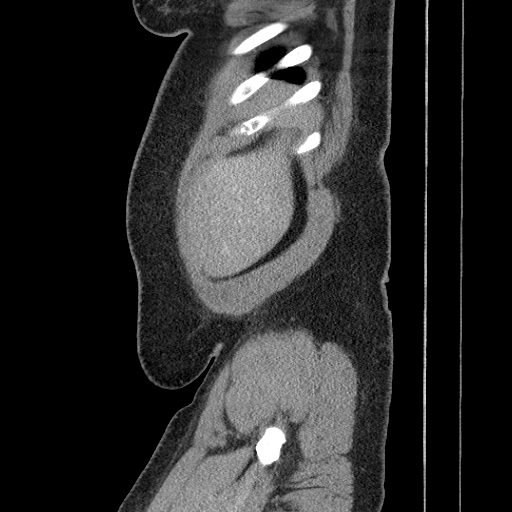
[im 53/170  soft-tissue]
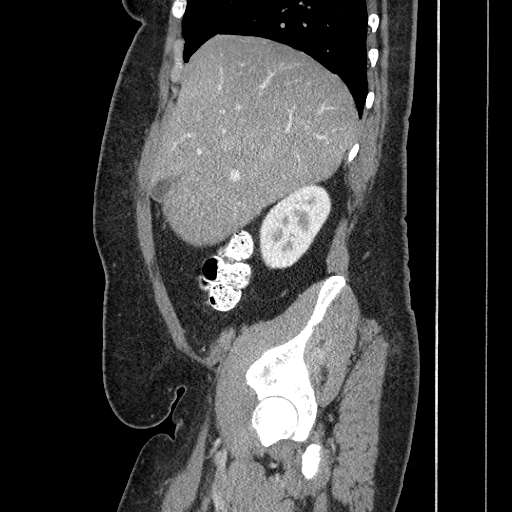
[im 74/170  soft-tissue]
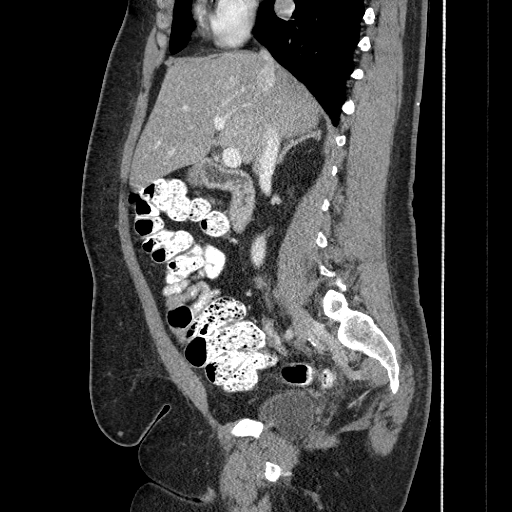

[12 of 36 positions shown; findings below may reference images not displayed]

FINDINGS: Lower chest: Unremarkable.

Hepatobiliary: Diffuse low attenuation throughout the hepatic
parenchyma, compatible with underlying hepatic steatosis. No cystic
or solid hepatic lesions. No intra or extrahepatic biliary ductal
dilatation. Gallbladder is normal in appearance.

Pancreas: 8 mm low-attenuation lesion in the head of the pancreas
(image 37 of series 3) and 4 mm low-attenuation lesion in the tail
of the pancreas (image 29 of series 3) are both indeterminate. No
larger pancreatic mass. No pancreatic ductal dilatation. No
pancreatic or peripancreatic inflammatory changes.

Spleen: Unremarkable.

Adrenals/Urinary Tract: Tooth sub cm low-attenuation lesion in the
lower pole of the right kidney is too small to definitively
characterize, but is statistically likely a tiny cyst. Left kidney
and bilateral adrenal glands are normal in appearance. There is no
hydroureteronephrosis or perinephric stranding to indicate urinary
tract obstruction at this time. Urinary bladder is normal in
appearance.

Stomach/Bowel: Normal appearance of the stomach. No pathologic
dilatation of small bowel or colon. Normal appendix.

Vascular/Lymphatic: Aortic atherosclerosis, without evidence of
aneurysm or dissection in the abdominal or pelvic vasculature. No
lymphadenopathy noted in the abdomen or pelvis.

Reproductive: Status post hysterectomy. Ovaries are unremarkable in
appearance.

Other: No significant volume of ascites.  No pneumoperitoneum.

Musculoskeletal: There are no aggressive appearing lytic or blastic
lesions noted in the visualized portions of the skeleton.
IMPRESSION: 1. No acute findings in the abdomen or pelvis to account for the
patient's symptoms.
2. Hepatic steatosis.
3. **An incidental finding of potential clinical significance has
been found. Two tiny low-attenuation lesions in the pancreas,
largest of which measures only 8 mm in the pancreatic head.
Follow-up evaluation with MRI of the abdomen with and without IV
gadolinium with MRCP is recommended in 1 year to ensure the
stability or resolution of these findings and to provide better
characterization. This recommendation follows ACR consensus
guidelines: Management of Incidental Pancreatic Cysts: A White Paper
of the ACR Incidental Findings Committee. [HOSPITAL]
1656;[DATE].**
4. Normal appendix.
5. Aortic atherosclerosis.
6. Additional incidental findings, as above.

## 2021-07-05 DIAGNOSIS — Z0289 Encounter for other administrative examinations: Secondary | ICD-10-CM

## 2021-07-27 ENCOUNTER — Ambulatory Visit (INDEPENDENT_AMBULATORY_CARE_PROVIDER_SITE_OTHER): Payer: 59 | Admitting: Family Medicine

## 2021-07-27 ENCOUNTER — Encounter (INDEPENDENT_AMBULATORY_CARE_PROVIDER_SITE_OTHER): Payer: Self-pay | Admitting: Family Medicine

## 2021-07-27 VITALS — BP 112/74 | HR 71 | Temp 97.6°F | Ht 64.0 in | Wt 211.0 lb

## 2021-07-27 DIAGNOSIS — E1169 Type 2 diabetes mellitus with other specified complication: Secondary | ICD-10-CM

## 2021-07-27 DIAGNOSIS — E668 Other obesity: Secondary | ICD-10-CM

## 2021-07-27 DIAGNOSIS — R0602 Shortness of breath: Secondary | ICD-10-CM

## 2021-07-27 DIAGNOSIS — E7849 Other hyperlipidemia: Secondary | ICD-10-CM | POA: Diagnosis not present

## 2021-07-27 DIAGNOSIS — E038 Other specified hypothyroidism: Secondary | ICD-10-CM

## 2021-07-27 DIAGNOSIS — R5383 Other fatigue: Secondary | ICD-10-CM

## 2021-07-27 DIAGNOSIS — Z6836 Body mass index (BMI) 36.0-36.9, adult: Secondary | ICD-10-CM

## 2021-07-27 DIAGNOSIS — E559 Vitamin D deficiency, unspecified: Secondary | ICD-10-CM

## 2021-07-27 DIAGNOSIS — Z9189 Other specified personal risk factors, not elsewhere classified: Secondary | ICD-10-CM

## 2021-07-27 DIAGNOSIS — Z1331 Encounter for screening for depression: Secondary | ICD-10-CM | POA: Diagnosis not present

## 2021-07-28 LAB — INSULIN, RANDOM: INSULIN: 32.6 u[IU]/mL — ABNORMAL HIGH (ref 2.6–24.9)

## 2021-07-28 LAB — CMP14+EGFR
ALT: 22 IU/L (ref 0–32)
AST: 15 IU/L (ref 0–40)
Albumin/Globulin Ratio: 2 (ref 1.2–2.2)
Albumin: 4.4 g/dL (ref 3.8–4.8)
Alkaline Phosphatase: 55 IU/L (ref 44–121)
BUN/Creatinine Ratio: 14 (ref 9–23)
BUN: 9 mg/dL (ref 6–24)
Bilirubin Total: 0.7 mg/dL (ref 0.0–1.2)
CO2: 22 mmol/L (ref 20–29)
Calcium: 9.4 mg/dL (ref 8.7–10.2)
Chloride: 101 mmol/L (ref 96–106)
Creatinine, Ser: 0.63 mg/dL (ref 0.57–1.00)
Globulin, Total: 2.2 g/dL (ref 1.5–4.5)
Glucose: 108 mg/dL — ABNORMAL HIGH (ref 70–99)
Potassium: 4.5 mmol/L (ref 3.5–5.2)
Sodium: 140 mmol/L (ref 134–144)
Total Protein: 6.6 g/dL (ref 6.0–8.5)
eGFR: 110 mL/min/{1.73_m2} (ref >59)

## 2021-07-28 LAB — CBC WITH DIFFERENTIAL/PLATELET
Basophils Absolute: 0.1 10*3/uL (ref 0.0–0.2)
Basos: 1 %
EOS (ABSOLUTE): 0.2 10*3/uL (ref 0.0–0.4)
Eos: 2 %
Hematocrit: 40.7 % (ref 34.0–46.6)
Hemoglobin: 13.2 g/dL (ref 11.1–15.9)
Immature Grans (Abs): 0.1 10*3/uL (ref 0.0–0.1)
Immature Granulocytes: 1 %
Lymphocytes Absolute: 3.5 10*3/uL — ABNORMAL HIGH (ref 0.7–3.1)
Lymphs: 29 %
MCH: 27.4 pg (ref 26.6–33.0)
MCHC: 32.4 g/dL (ref 31.5–35.7)
MCV: 85 fL (ref 79–97)
Monocytes Absolute: 0.5 10*3/uL (ref 0.1–0.9)
Monocytes: 4 %
Neutrophils Absolute: 7.8 10*3/uL — ABNORMAL HIGH (ref 1.4–7.0)
Neutrophils: 63 %
Platelets: 308 10*3/uL (ref 150–450)
RBC: 4.81 x10E6/uL (ref 3.77–5.28)
RDW: 12.8 % (ref 11.7–15.4)
WBC: 12.2 10*3/uL — ABNORMAL HIGH (ref 3.4–10.8)

## 2021-07-28 LAB — LIPID PANEL WITH LDL/HDL RATIO
Cholesterol, Total: 160 mg/dL (ref 100–199)
HDL: 37 mg/dL — ABNORMAL LOW (ref >39)
LDL Chol Calc (NIH): 90 mg/dL (ref 0–99)
LDL/HDL Ratio: 2.4 ratio (ref 0.0–3.2)
Triglycerides: 194 mg/dL — ABNORMAL HIGH (ref 0–149)
VLDL Cholesterol Cal: 33 mg/dL (ref 5–40)

## 2021-07-28 LAB — T4, FREE: Free T4: 1.71 ng/dL (ref 0.82–1.77)

## 2021-07-28 LAB — T3: T3, Total: 133 ng/dL (ref 71–180)

## 2021-07-28 LAB — VITAMIN D 25 HYDROXY (VIT D DEFICIENCY, FRACTURES): Vit D, 25-Hydroxy: 17.6 ng/mL — ABNORMAL LOW (ref 30.0–100.0)

## 2021-07-28 LAB — HEMOGLOBIN A1C
Est. average glucose Bld gHb Est-mCnc: 140 mg/dL
Hgb A1c MFr Bld: 6.5 % — ABNORMAL HIGH (ref 4.8–5.6)

## 2021-07-28 LAB — TSH: TSH: 0.756 u[IU]/mL (ref 0.450–4.500)

## 2021-08-08 NOTE — Progress Notes (Signed)
? ? ? ? ?Chief Complaint:  ? ?OBESITY ?Sherri [name] (MR# [id number]) is a 46 y.o. female who presents for evaluation and treatment of obesity and related comorbidities. Current BMI is Body mass index is 36.22 kg/m?Marland Kitchen Kyliyah has been struggling with her weight for many years and has been unsuccessful in either losing weight, maintaining weight loss, or reaching her healthy weight goal. ? ?Hanae is currently in the action stage of change and ready to dedicate time achieving and maintaining a healthier weight. Brinn is interested in becoming our patient and working on intensive lifestyle modifications including (but not limited to) diet and exercise for weight loss. ? ?Anjelita's habits were reviewed today and are as follows: Her family eats meals together, she thinks her family will eat healthier with her, her desired weight loss is 41 lbs, she has been heavy most of her life, she started gaining weight after she got married, her heaviest weight ever was 220 pounds, she has significant food cravings issues, she snacks frequently in the evenings, she skips meals frequently, she is frequently drinking liquids with calories, and she frequently eats larger portions than normal. ? ?Depression Screen ?Sharran's Food and Mood (modified PHQ-9) score was 8. ? ? ?  07/27/2021  ?  9:24 AM  ?Depression screen PHQ 2/9  ?Decreased Interest 2  ?Down, Depressed, Hopeless 2  ?PHQ - 2 Score 4  ?Altered sleeping 0  ?Tired, decreased energy 2  ?Change in appetite 2  ?Feeling bad or failure about yourself  0  ?Trouble concentrating 0  ?Moving slowly or fidgety/restless 0  ?Suicidal thoughts 0  ?PHQ-9 Score 8  ?Difficult doing work/chores Not difficult at all  ? ?Subjective:  ? ?1. Other fatigue ?Jayci admits to daytime somnolence and admits to waking up still tired. Patient has a history of symptoms of daytime fatigue, morning fatigue, and morning headache. Briarrose generally gets 7 or 8 hours of sleep per night, and states that  she has generally restful sleep. Snoring is present. Apneic episodes are not present. Epworth Sleepiness Score is 5.  ? ?2. SOB (shortness of breath) on exertion, mild ?Tyhesha notes increasing shortness of breath with exercising and seems to be worsening over time with weight gain. She notes getting out of breath sooner with activity than she used to. This has not gotten worse recently. Marah denies shortness of breath at rest or orthopnea. ? ?3. Type 2 diabetes mellitus with other specified complication, unspecified whether long term insulin use ([place]) ?Syvanna is on Ozempic. Her fasting blood sugars mostly range between 130-160's. She is ready to work on diet, exercise, and weight loss to help to control.  ? ?4. Other specified hypothyroidism ?Daejah is on 175 mcg of levothyroxine. She is working on diet and weight loss. She is due for labs.  ? ?5. Other hyperlipidemia ?Akshitha is on Crestor 20 mg and she is ready to work on diet and weight loss.  ? ?6. Vitamin D deficiency ?Braylin is due for labs and she notes fatigue. ? ?7. At risk for heart disease ?Demaria is at higher than average risk for cardiovascular disease due to obesity. ? ?Assessment/Plan:  ? ?1. Other fatigue ?Autumn does feel that her weight is causing her energy to be lower than it should be. Fatigue may be related to obesity, depression or many other causes. Labs will be ordered, and in the meanwhile, Korena will focus on self care including making healthy food choices, increasing physical activity and focusing on stress reduction. ? ?-  EKG 12-Lead ?- CMP14+EGFR ?- CBC with Differential/Platelet ?- T4, free ?- TSH ?- T3 ?- VITAMIN D 25 Hydroxy (Vit-D Deficiency, Fractures) ? ?2. SOB (shortness of breath) on exertion, mild ?Ardenia does feel that she gets out of breath more easily that she used to when she exercises. Lashonna's shortness of breath appears to be obesity related and exercise induced. She has agreed to work on weight loss and  gradually increase exercise to treat her exercise induced shortness of breath. Will continue to monitor closely. ? ?3. Type 2 diabetes mellitus with other specified complication, unspecified whether long term insulin use ([place]) ?We will check labs today. Xandrea will start her Category 2 plan. Good blood sugar control is important to decrease the likelihood of diabetic complications such as nephropathy, neuropathy, limb loss, blindness, coronary artery disease, and death. Intensive lifestyle modification including diet, exercise and weight loss are the first line of treatment for diabetes.  ? ?- Insulin, random ?- Hemoglobin A1c ? ?4. Other specified hypothyroidism ?We will check labs today. Orders and follow up as documented in patient record. ? ?5. Other hyperlipidemia ?Cardiovascular risk and specific lipid/LDL goals reviewed.  We discussed several lifestyle modifications today. We will check labs today. Saniyya will start her Category 2 plan. Orders and follow up as documented in patient record.  ? ?- Lipid Panel With LDL/HDL Ratio ? ?6. Vitamin D deficiency ?Low Vitamin D level contributes to fatigue and are associated with obesity, breast, and colon cancer. We will check labs today. Dayleen will follow-up for routine testing of Vitamin D, at least 2-3 times per year to avoid over-replacement. ? ?7. Depression screening ?Jakyra had a positive depression screening. Depression is commonly associated with obesity and often results in emotional eating behaviors. We will monitor this closely and work on CBT to help improve the non-hunger eating patterns. Referral to Psychology may be required if no improvement is seen as she continues in our clinic. ? ?8. At risk for heart disease ?Micaiah was given approximately 30 minutes of coronary artery disease prevention counseling today. She is 46 y.o. female and has risk factors for heart disease including obesity. We discussed intensive lifestyle modifications today with  an emphasis on specific weight loss instructions and strategies. ? ?Repetitive spaced learning was employed today to elicit superior memory formation and behavioral change.  ? ?9. Class 2 severe obesity with serious comorbidity and body mass index (BMI) of 36.0 to 36.9 in adult, unspecified obesity type ([place]) ?Shervon is currently in the action stage of change and her goal is to continue with weight loss efforts. I recommend Elisabetta begin the structured treatment plan as follows: ? ?She has agreed to the Category 2 Plan + 100 calories. ? ?Exercise goals: No exercise has been prescribed for now, while we concentrate on nutritional changes.  ? ?Behavioral modification strategies: increasing lean protein intake. ? ?She was informed of the importance of frequent follow-up visits to maximize her success with intensive lifestyle modifications for her multiple health conditions. She was informed we would discuss her lab results at her next visit unless there is a critical issue that needs to be addressed sooner. Kirin agreed to keep her next visit at the agreed upon time to discuss these results. ? ?Objective:  ? ?Blood pressure 112/74, pulse 71, temperature 97.6 ?F (36.4 ?C), height 5' 4"  (1.626 m), weight 211 lb (95.7 kg), SpO2 96 %. Body mass index is 36.22 kg/m?. ? ?EKG: Normal sinus rhythm, rate 65 BPM. ? ?Engineer, petroleum completed  today shows a VO2 of 297 lbs and a REE of 2045.  Her calculated basal metabolic rate is 8242 thus her basal metabolic rate is better than expected. ? ?General: Cooperative, alert, well developed, in no acute distress. ?HEENT: Conjunctivae and lids unremarkable. ?Cardiovascular: Regular rhythm.  ?Lungs: Normal work of breathing. ?Neurologic: No focal deficits.  ? ?Lab Results  ?Component Value Date  ? CREATININE 0.63 07/27/2021  ? BUN 9 07/27/2021  ? NA 140 07/27/2021  ? K 4.5 07/27/2021  ? CL 101 07/27/2021  ? CO2 22 07/27/2021  ? ?Lab Results  ?Component Value Date  ? ALT 22  07/27/2021  ? AST 15 07/27/2021  ? ALKPHOS 55 07/27/2021  ? BILITOT 0.7 07/27/2021  ? ?Lab Results  ?Component Value Date  ? HGBA1C 6.5 (H) 07/27/2021  ? ?Lab Results  ?Component Value Date  ? INSULIN 32.6 (H) 04

## 2021-08-10 ENCOUNTER — Ambulatory Visit (INDEPENDENT_AMBULATORY_CARE_PROVIDER_SITE_OTHER): Payer: 59 | Admitting: Family Medicine

## 2021-08-10 ENCOUNTER — Encounter (INDEPENDENT_AMBULATORY_CARE_PROVIDER_SITE_OTHER): Payer: Self-pay | Admitting: Family Medicine

## 2021-08-10 VITALS — BP 125/78 | HR 60 | Temp 97.5°F | Ht 64.0 in | Wt 206.0 lb

## 2021-08-10 DIAGNOSIS — E782 Mixed hyperlipidemia: Secondary | ICD-10-CM

## 2021-08-10 DIAGNOSIS — E1169 Type 2 diabetes mellitus with other specified complication: Secondary | ICD-10-CM | POA: Diagnosis not present

## 2021-08-10 DIAGNOSIS — Z6836 Body mass index (BMI) 36.0-36.9, adult: Secondary | ICD-10-CM

## 2021-08-10 DIAGNOSIS — Z7985 Long-term (current) use of injectable non-insulin antidiabetic drugs: Secondary | ICD-10-CM

## 2021-08-10 DIAGNOSIS — E509 Vitamin A deficiency, unspecified: Secondary | ICD-10-CM

## 2021-08-10 DIAGNOSIS — E669 Obesity, unspecified: Secondary | ICD-10-CM

## 2021-08-10 DIAGNOSIS — E559 Vitamin D deficiency, unspecified: Secondary | ICD-10-CM

## 2021-08-10 DIAGNOSIS — Z9189 Other specified personal risk factors, not elsewhere classified: Secondary | ICD-10-CM

## 2021-08-10 DIAGNOSIS — F5089 Other specified eating disorder: Secondary | ICD-10-CM

## 2021-08-10 DIAGNOSIS — Z6835 Body mass index (BMI) 35.0-35.9, adult: Secondary | ICD-10-CM

## 2021-08-10 MED ORDER — ERGOCALCIFEROL 1.25 MG (50000 UT) PO CAPS: 50000.0000 [IU] | ORAL_CAPSULE | ORAL | 0 refills | Status: DC

## 2021-08-10 MED ORDER — SEMAGLUTIDE (1 MG/DOSE) 4 MG/3ML ~~LOC~~ SOPN: PEN_INJECTOR | SUBCUTANEOUS | 0 refills | Status: DC

## 2021-08-13 ENCOUNTER — Encounter (INDEPENDENT_AMBULATORY_CARE_PROVIDER_SITE_OTHER): Payer: Self-pay | Admitting: Family Medicine

## 2021-08-13 MED ORDER — BUPROPION HCL ER (SR) 150 MG PO TB12: 150.0000 mg | ORAL_TABLET | Freq: Every day | ORAL | 0 refills | Status: DC

## 2021-08-13 NOTE — Telephone Encounter (Signed)
Can you please pull the chart and let me check?

## 2021-08-13 NOTE — Telephone Encounter (Signed)
Pharmacy updated, please advise

## 2021-08-16 NOTE — Progress Notes (Signed)
Chief Complaint:   OBESITY Sherri [name] is here to discuss her progress with her obesity treatment plan along with follow-up of her obesity related diagnoses. Sherri [name] is on the Category 2 Plan + 100 calories and states she is following her eating plan approximately 100% of the time. Sherri [name] states she is doing 0 minutes 0 times per week.  Today's visit was #: 2 Starting weight: 211 lbs Starting date: 07/27/2021 Today's weight: 206 lbs Today's date: 08/10/2021 Total lbs lost to date: 5 Total lbs lost since last in-office visit: 5  Interim History: Sherri [name] has done very well with her diet and weight loss. She has not "grazed" as much as she used to and she feels somewhat deprived by this. Her hunger was mostly controlled and she struggled to eat all of the food on her plan especially for dinner.   Subjective:   1. Vitamin D deficiency Salah has a new diagnosis of Vitamin D deficiency. Her Vitamin D level is not at goal. I discussed labs with the patient today.  2. Type 2 diabetes mellitus with other specified complication, unspecified whether long term insulin use (HCC) Sherri [name] is on Ozempic 0.5 mg. She still notes some daytime polyphagia and constipation. I discussed labs with the patient today.  3. Mixed hyperlipidemia Sherri [name]'s levels are worsening. Her HDL is low and triglycerides are elevated. She is working on her diet and weight loss. I discussed labs with the patient today.   4. Other disorder of eating, with emotinal eating Sherri [name] has a new diagnosis of emotional eating behaviors. She is struggling with feeling deprived and she notes emotional eating behaviors. She is open to looking at medications to help.   5. At risk for heart disease Sherri [name] is at higher than average risk for cardiovascular disease due to obesity.  Assessment/Plan:   1. Vitamin D deficiency Sherri [name] agreed to start prescription Vitamin D 50,000 IU weekly, with no refills. She will follow-up for  routine testing of Vitamin D, at least 2-3 times per year to avoid over-replacement.  - ergocalciferol (VITAMIN D2) 1.25 MG (50000 UT) capsule; Take 1 capsule (50,000 Units total) by mouth once a week.  Dispense: 4 capsule; Refill: 0  2. Type 2 diabetes mellitus with other specified complication, unspecified whether long term insulin use ([place]) Sherri [name] agreed to increase Ozempic to 1 mg (start at 3.38 mg or 54 clicks). She will continue her diet, miralax, and water as needed.   - Semaglutide, 1 MG/DOSE, 4 MG/3ML SOPN; Patient to inject subq 2.50 mg (= 54 clicks) once weekly  Dispense: 3 mL; Refill: 0  3. Mixed hyperlipidemia Sherri [name] will continue with her diet and medications, and we will recheck labs in 3 months.   4. Other disorder of eating, with emotinal eating Sherri [name] agreed to start Wellbutrin SR 150 mg q Am with no refills, and will follow up closely especially with her current medications. Behavior modification techniques were discussed today to help Sherri [name] deal with her emotional/non-hunger eating behaviors. Orders and follow up as documented in patient record.   - buPROPion (WELLBUTRIN SR) 150 MG 12 hr tablet; Take 1 tablet (150 mg total) by mouth daily.  Dispense: 30 tablet; Refill: 0  5. At risk for heart disease Sherri [name] was given approximately 30 minutes of coronary artery disease prevention counseling today. She is 46 y.o. female and has risk factors for heart disease including obesity. We discussed intensive lifestyle modifications today with an emphasis on specific weight loss instructions and strategies.  Repetitive spaced learning was employed today to elicit superior memory formation and behavioral change.   6. Obesity, current BMI 35.4 Sherri [name] is currently in the action stage of change. As such, her goal is to continue with weight loss efforts. She has agreed to the Category 2 Plan.   Behavioral modification strategies: increasing lean protein intake.  Sherri [name] has  agreed to follow-up with our clinic in 2 weeks. She was informed of the importance of frequent follow-up visits to maximize her success with intensive lifestyle modifications for her multiple health conditions.   Objective:   Blood pressure 125/78, pulse 60, temperature (!) 97.5 F (36.4 C), height '5\' 4"'$  (1.626 m), weight 206 lb (93.4 kg), SpO2 98 %. Body mass index is 35.36 kg/m.  General: Cooperative, alert, well developed, in no acute distress. HEENT: Conjunctivae and lids unremarkable. Cardiovascular: Regular rhythm.  Lungs: Normal work of breathing. Neurologic: No focal deficits.   Lab Results  Component Value Date   CREATININE 0.63 07/27/2021   BUN 9 07/27/2021   NA 140 07/27/2021   K 4.5 07/27/2021   CL 101 07/27/2021   CO2 22 07/27/2021   Lab Results  Component Value Date   ALT 22 07/27/2021   AST 15 07/27/2021   ALKPHOS 55 07/27/2021   BILITOT 0.7 07/27/2021   Lab Results  Component Value Date   HGBA1C 6.5 (H) 07/27/2021   Lab Results  Component Value Date   INSULIN 32.6 (H) 07/27/2021   Lab Results  Component Value Date   TSH 0.756 07/27/2021   Lab Results  Component Value Date   CHOL 160 07/27/2021   HDL 37 (L) 07/27/2021   LDLCALC 90 07/27/2021   TRIG 194 (H) 07/27/2021   Lab Results  Component Value Date   VD25OH 17.6 (L) 07/27/2021   Lab Results  Component Value Date   WBC 12.2 (H) 07/27/2021   HGB 13.2 07/27/2021   HCT 40.7 07/27/2021   MCV 85 07/27/2021   PLT 308 07/27/2021   No results found for: IRON, TIBC, FERRITIN  Attestation Statements:   Reviewed by clinician on day of visit: allergies, medications, problem list, medical history, surgical history, family history, social history, and previous encounter notes.   I, Trixie Dredge, am acting as transcriptionist for Dennard Nip, MD.  I have reviewed the above documentation for accuracy and completeness, and I agree with the above. -  Dennard Nip, MD

## 2021-08-21 NOTE — Progress Notes (Signed)
?TeleHealth Visit:  ?This visit was completed with telemedicine (audio/video) technology. ?Sherri [name] has verbally consented to this TeleHealth visit. The patient is located at home, the provider is located at home. The participants in this visit include the listed provider and patient. The visit was conducted today via MyChart video. ? ?OBESITY ?Sherri [name] is here to discuss her progress with her obesity treatment plan along with follow-up of her obesity related diagnoses.  ? ?Today's visit was # 3 ?Starting weight: 211 lbs ?Starting date: 07/27/2021 ?Weight at last in office visit: 206 lbs on 08/10/21 ?Total weight loss: 5 lbs at last in office visit on 08/10/21. ?Today's reported weight: 197 lbs  ? ?Nutrition Plan: the Category 2 Plan + 100 calories ?Hunger is well controlled. Cravings are well controlled.  ?Current exercise: none  ?Will start [employer] in 4 days. ? ?Interim History: Sherri [name] is very happy with how things are going with her meal plan.  According to her reported weight today she has lost a total of 14 pounds in about a month.  Her husband and daughter are also patients at the clinic.  She denies intake of sugar sweetened beverages.  Water intake is adequate.  Appetite is well controlled.  Cravings much better since starting bupropion.  She would like more variety with her dinner. ? ?Assessment/Plan:  ?1. Type II Diabetes ?HgbA1c is at goal. ?Any episodes of hypoglycemia? no ?Medication(s): Ozempic 0.75 mg weekly ? ?Lab Results  ?Component Value Date  ? HGBA1C 6.5 (H) 07/27/2021  ? ?Lab Results  ?Component Value Date  ? [place] 90 07/27/2021  ? CREATININE 0.63 07/27/2021  ? ? ?Plan: ?Continue Ozempic at current dose (0.75 mg weekly) until visit with Dr. Leafy Ro. ? ?2.  Other disorder of eating with emotional eating ?Well-controlled.  She is very happy with the effect of the bupropion on her cravings. ?Mood stable. ? ?Plan: ?Continue bupropion 150 mg daily. ? ?Obesity: Current BMI 35.4 ?Sherri [name]  is currently in the action stage of change. As such, her goal is to continue with weight loss efforts.  ?She has agreed to the Category 2 Plan and keeping a food journal and adhering to recommended goals of 400-600 calories and 35 gms protein at dinner. ? ?Exercise goals: She will start working out at [employer]. ? ?Behavioral modification strategies: meal planning and cooking strategies, planning for success, and keeping a strict food journal. ?Journaling numbers, dining out handout, protein equivalents handout, recipes handout provided via [place]. ? ?Sherri [name] has agreed to follow-up with our clinic in 2 weeks.  ? ?No orders of the defined types were placed in this encounter. ? ? ?There are no discontinued medications.  ? ?No orders of the defined types were placed in this encounter. ?   ? ?Objective:  ? ?VITALS: Per patient if applicable, see vitals. ?GENERAL: Alert and in no acute distress. ?CARDIOPULMONARY: No increased WOB. Speaking in clear sentences.  ?PSYCH: Pleasant and cooperative. Speech normal rate and rhythm. Affect is appropriate. Insight and judgement are appropriate. Attention is focused, linear, and appropriate.  ?NEURO: Oriented as arrived to appointment on time with no prompting.  ? ?Lab Results  ?Component Value Date  ? CREATININE 0.63 07/27/2021  ? BUN 9 07/27/2021  ? NA 140 07/27/2021  ? K 4.5 07/27/2021  ? CL 101 07/27/2021  ? CO2 22 07/27/2021  ? ?Lab Results  ?Component Value Date  ? ALT 22 07/27/2021  ? AST 15 07/27/2021  ? ALKPHOS 55 07/27/2021  ? BILITOT 0.7 07/27/2021  ? ?  Lab Results  ?Component Value Date  ? HGBA1C 6.5 (H) 07/27/2021  ? ?Lab Results  ?Component Value Date  ? INSULIN 32.6 (H) 07/27/2021  ? ?Lab Results  ?Component Value Date  ? TSH 0.756 07/27/2021  ? ?Lab Results  ?Component Value Date  ? CHOL 160 07/27/2021  ? HDL 37 (L) 07/27/2021  ? [place] 90 07/27/2021  ? TRIG 194 (H) 07/27/2021  ? ?Lab Results  ?Component Value Date  ? WBC 12.2 (H) 07/27/2021  ? HGB 13.2  07/27/2021  ? HCT 40.7 07/27/2021  ? MCV 85 07/27/2021  ? PLT 308 07/27/2021  ? ?No results found for: IRON, TIBC, FERRITIN ?Lab Results  ?Component Value Date  ? VD25OH 17.6 (L) 07/27/2021  ? ? ?Attestation Statements:  ? ?Reviewed by clinician on day of visit: allergies, medications, problem list, medical history, surgical history, family history, social history, and previous encounter notes. ? ?Time spent on visit including pre-visit chart review and post-visit charting and care was 32 minutes.  ? ? ?

## 2021-08-23 ENCOUNTER — Telehealth (INDEPENDENT_AMBULATORY_CARE_PROVIDER_SITE_OTHER): Payer: 59 | Admitting: Family Medicine

## 2021-08-23 ENCOUNTER — Encounter (INDEPENDENT_AMBULATORY_CARE_PROVIDER_SITE_OTHER): Payer: Self-pay | Admitting: Family Medicine

## 2021-08-23 DIAGNOSIS — Z6836 Body mass index (BMI) 36.0-36.9, adult: Secondary | ICD-10-CM

## 2021-08-23 DIAGNOSIS — F5089 Other specified eating disorder: Secondary | ICD-10-CM | POA: Diagnosis not present

## 2021-08-23 DIAGNOSIS — E1169 Type 2 diabetes mellitus with other specified complication: Secondary | ICD-10-CM | POA: Insufficient documentation

## 2021-08-23 DIAGNOSIS — Z7985 Long-term (current) use of injectable non-insulin antidiabetic drugs: Secondary | ICD-10-CM

## 2021-08-29 ENCOUNTER — Telehealth (INDEPENDENT_AMBULATORY_CARE_PROVIDER_SITE_OTHER): Payer: Self-pay | Admitting: Bariatrics

## 2021-08-29 NOTE — Telephone Encounter (Signed)
error 

## 2021-08-30 ENCOUNTER — Other Ambulatory Visit (INDEPENDENT_AMBULATORY_CARE_PROVIDER_SITE_OTHER): Payer: Self-pay | Admitting: Family Medicine

## 2021-08-30 DIAGNOSIS — E559 Vitamin D deficiency, unspecified: Secondary | ICD-10-CM

## 2021-09-06 ENCOUNTER — Ambulatory Visit (INDEPENDENT_AMBULATORY_CARE_PROVIDER_SITE_OTHER): Payer: 59 | Admitting: Family Medicine

## 2021-09-06 ENCOUNTER — Encounter (INDEPENDENT_AMBULATORY_CARE_PROVIDER_SITE_OTHER): Payer: Self-pay | Admitting: Family Medicine

## 2021-09-06 VITALS — BP 94/59 | HR 61 | Temp 97.7°F | Ht 64.0 in | Wt 196.0 lb

## 2021-09-06 DIAGNOSIS — F5089 Other specified eating disorder: Secondary | ICD-10-CM | POA: Diagnosis not present

## 2021-09-06 DIAGNOSIS — E559 Vitamin D deficiency, unspecified: Secondary | ICD-10-CM | POA: Diagnosis not present

## 2021-09-06 DIAGNOSIS — Z6833 Body mass index (BMI) 33.0-33.9, adult: Secondary | ICD-10-CM

## 2021-09-06 DIAGNOSIS — Z7985 Long-term (current) use of injectable non-insulin antidiabetic drugs: Secondary | ICD-10-CM

## 2021-09-06 DIAGNOSIS — E669 Obesity, unspecified: Secondary | ICD-10-CM | POA: Diagnosis not present

## 2021-09-06 DIAGNOSIS — E1169 Type 2 diabetes mellitus with other specified complication: Secondary | ICD-10-CM | POA: Diagnosis not present

## 2021-09-06 MED ORDER — BUPROPION HCL ER (SR) 200 MG PO TB12: 200.0000 mg | ORAL_TABLET | Freq: Every day | ORAL | 0 refills | Status: DC

## 2021-09-06 MED ORDER — SEMAGLUTIDE (1 MG/DOSE) 4 MG/3ML ~~LOC~~ SOPN: PEN_INJECTOR | SUBCUTANEOUS | 0 refills | Status: DC

## 2021-09-06 MED ORDER — ERGOCALCIFEROL 1.25 MG (50000 UT) PO CAPS: 50000.0000 [IU] | ORAL_CAPSULE | ORAL | 0 refills | Status: DC

## 2021-09-18 NOTE — Progress Notes (Signed)
Chief Complaint:   OBESITY Sherri [name] is here to discuss her progress with her obesity treatment plan along with follow-up of her obesity related diagnoses. Sherri [name] is on the Category 2 Plan and keeping a food journal and adhering to recommended goals of 400-600 calories and 35 grams of protein at supper daily and states she is following her eating plan approximately 100% of the time. Sherri [name] states she is doing  0 minutes 0 times per week.  Today's visit was #: 4 Starting weight: 211 lbs Starting date: 07/27/2021 Today's weight: 196 lbs Today's date: 09/06/2021 Total lbs lost to date: 15 Total lbs lost since last in-office visit: 10  Interim History: Sherri [name] continues to do very well with weight loss. She is struggling with hunger at times but she is feeling deprived. She continues to weigh herself and she is feeling she is stalling with weight loss even though she has done well. She has a lot of questions about nutrition, goals, meal planning, etc.   Subjective:   1. Vitamin D deficiency Sherri [name] is on Vitamin D, but her level is not yet at goal.   2. Type 2 diabetes mellitus with other specified complication, unspecified whether long term insulin use ([place]) Sherri [name] is working on her diet, and she is doing well with weight loss. No side effects of medications were noted.   3. Other disorder of eating, with emotinal eating Sherri [name] notes feeling irritable and deprived. She feels the Wellbutrin is helping, but maybe not enough.   Assessment/Plan:   1. Vitamin D deficiency [place] will continue prescription Vitamin D 50,000 IU every week, and we will refill for 1 month. She will follow-up for routine testing of Vitamin D, at least 2-3 times per year to avoid over-replacement.  - ergocalciferol (VITAMIN D2) 1.25 MG (50000 UT) capsule; Take 1 capsule (50,000 Units total) by mouth once a week.  Dispense: 4 capsule; Refill: 0  2. Type 2 diabetes mellitus with other specified complication,  unspecified whether long term insulin use (HCC) We will refill Ozempic for 1 month. Sherri [name] will continue with her diet and exercise. Good blood sugar control is important to decrease the likelihood of diabetic complications such as nephropathy, neuropathy, limb loss, blindness, coronary artery disease, and death. Intensive lifestyle modification including diet, exercise and weight loss are the first line of treatment for diabetes.   - Semaglutide, 1 MG/DOSE, 4 MG/3ML SOPN; Patient to inject subq 5.28 mg (= 54 clicks) once weekly  Dispense: 3 mL; Refill: 0  3. Other disorder of eating, with emotinal eating Sherri [name] agreed to increase Wellbutrin SR to 200 mg q daily with no refills. Behavior modification techniques were discussed today to help Sherri [name] deal with her emotional/non-hunger eating behaviors.  Orders and follow up as documented in patient record.   - buPROPion (WELLBUTRIN SR) 200 MG 12 hr tablet; Take 1 tablet (200 mg total) by mouth daily.  Dispense: 30 tablet; Refill: 0  4. Obesity, Current BMI 33.8 Sherri [name] is currently in the action stage of change. As such, her goal is to continue with weight loss efforts. She has agreed to change to keeping a food journal and adhering to recommended goals of 1500 calories and 100+ grams of protein daily.   Behavioral modification strategies: increasing lean protein intake.  Sherri [name] has agreed to follow-up with our clinic in 3 weeks with Sherri Schwab, FNP-C. She was informed of the importance of frequent follow-up visits to maximize her success with intensive lifestyle modifications for her  multiple health conditions.   Objective:   Blood pressure (!) 94/59, pulse 61, temperature 97.7 F (36.5 C), height '5\' 4"'$  (1.626 m), weight 196 lb (88.9 kg), SpO2 97 %. Body mass index is 33.64 kg/m.  General: Cooperative, alert, well developed, in no acute distress. HEENT: Conjunctivae and lids unremarkable. Cardiovascular: Regular rhythm.  Lungs: Normal  work of breathing. Neurologic: No focal deficits.   Lab Results  Component Value Date   CREATININE 0.63 07/27/2021   BUN 9 07/27/2021   NA 140 07/27/2021   K 4.5 07/27/2021   CL 101 07/27/2021   CO2 22 07/27/2021   Lab Results  Component Value Date   ALT 22 07/27/2021   AST 15 07/27/2021   ALKPHOS 55 07/27/2021   BILITOT 0.7 07/27/2021   Lab Results  Component Value Date   HGBA1C 6.5 (H) 07/27/2021   Lab Results  Component Value Date   INSULIN 32.6 (H) 07/27/2021   Lab Results  Component Value Date   TSH 0.756 07/27/2021   Lab Results  Component Value Date   CHOL 160 07/27/2021   HDL 37 (L) 07/27/2021   LDLCALC 90 07/27/2021   TRIG 194 (H) 07/27/2021   Lab Results  Component Value Date   VD25OH 17.6 (L) 07/27/2021   Lab Results  Component Value Date   WBC 12.2 (H) 07/27/2021   HGB 13.2 07/27/2021   HCT 40.7 07/27/2021   MCV 85 07/27/2021   PLT 308 07/27/2021   No results found for: IRON, TIBC, FERRITIN  Attestation Statements:   Reviewed by clinician on day of visit: allergies, medications, problem list, medical history, surgical history, family history, social history, and previous encounter notes.  Time spent on visit including pre-visit chart review and post-visit care and charting was 40 minutes.   I, Sherri [name], am acting as transcriptionist for Dennard Nip, MD.  I have reviewed the above documentation for accuracy and completeness, and I agree with the above. -  Dennard Nip, MD

## 2021-09-19 NOTE — Progress Notes (Signed)
TeleHealth Visit:  This visit was completed with telemedicine (audio/video) technology. Sherri [name] has verbally consented to this TeleHealth visit. The patient is located at home, the provider is located at home. The participants in this visit include the listed provider and patient. The visit was conducted today via MyChart video.  OBESITY Sherri [name] is here to discuss her progress with her obesity treatment plan along with follow-up of her obesity related diagnoses.   Today's visit was # 5 Starting weight: 211 lbs Starting date: 07/27/2021 Weight at last in office visit: 193 lbs in office on 09/19/21 Total weight loss: 18 lbs at last in office  weigh in on 09/19/21.   Nutrition Plan: keeping a food journal and adhering to recommended goals of 1500 calories and 100+ protein.  Hunger is well controlled. Cravings are well controlled.  Current exercise: walks with dogs several days per week.  Interim History: Sherri [name] feels she is losing slowly but I assured her that her rate of weight loss and progress is excellent. She likes journaling and having the ability to choose a variety of foods.  She meets her protein goal about 3 times per week but mostly averages between 80 and 100. Water intake is adequate-she is unable to drink a lot of water due to urinary incontinence issues.  Assessment/Plan:  1. Type II Diabetes HgbA1c is at goal. DM managed by Dr. Barnet Pall ([facility])- sees every 6 months. CBGs: reports endo is happy with CBG readings Medication(s): Ozempic 1 mg weekly.  Lab Results  Component Value Date   HGBA1C 6.5 (H) 07/27/2021   Lab Results  Component Value Date   LDLCALC 90 07/27/2021   CREATININE 0.63 07/27/2021   Plan: Follow-up with Dr. Barnet Pall as directed Refill Ozempic 1 mg weekly. Semaglutide, 1 MG/DOSE, 4 MG/3ML SOPN   Sig: Inject 1 mg into the skin once a week.   Dispense:  3 mL   Refill:  0     2. Vitamin D Deficiency Vitamin D is very low at 17.6. She is on weekly  prescription Vitamin D 50,000 IU.  Lab Results  Component Value Date   VD25OH 17.6 (L) 07/27/2021    Plan: Continue prescription vitamin D 50,000 IU weekly. Refill: ergocalciferol (VITAMIN D2) 1.25 MG (50000 UT) capsule   Sig: Take 1 capsule (50,000 Units total) by mouth once a week.   Dispense:  12 capsule   Refill:  0    3. Obesity: Current BMI 33.8 Sherri [name] is currently in the action stage of change. As such, her goal is to continue with weight loss efforts.  She has agreed to keeping a food journal and adhering to recommended goals of 1500 calories and 100+ protein. Sherri [name]   Exercise goals: walk 20 min 3 x week.  Behavioral modification strategies: increasing lean protein intake and planning for success.  Sherri [name] has agreed to follow-up with our clinic in 4 weeks.   No orders of the defined types were placed in this encounter.   Medications Discontinued During This Encounter  Medication Reason   ergocalciferol (VITAMIN D2) 1.25 MG (50000 UT) capsule Reorder   Semaglutide, 1 MG/DOSE, 4 MG/3ML SOPN Reorder   levothyroxine (SYNTHROID) 175 MCG tablet Dose change     Meds ordered this encounter  Medications   ergocalciferol (VITAMIN D2) 1.25 MG (50000 UT) capsule    Sig: Take 1 capsule (50,000 Units total) by mouth once a week.    Dispense:  12 capsule    Refill:  0    Order Specific  Question:   Supervising Provider    Answer:   Dennard Nip D [[id number]]   Semaglutide, 1 MG/DOSE, 4 MG/3ML SOPN    Sig: Inject 1 mg into the skin once a week.    Dispense:  3 mL    Refill:  0    Order Specific Question:   Supervising Provider    Answer:   Dennard Nip D [[id number]]      Objective:   VITALS: Per patient if applicable, see vitals. GENERAL: Alert and in no acute distress. CARDIOPULMONARY: No increased WOB. Speaking in clear sentences.  PSYCH: Pleasant and cooperative. Speech normal rate and rhythm. Affect is appropriate. Insight and judgement are appropriate. Attention is  focused, linear, and appropriate.  NEURO: Oriented as arrived to appointment on time with no prompting.   Lab Results  Component Value Date   CREATININE 0.63 07/27/2021   BUN 9 07/27/2021   NA 140 07/27/2021   K 4.5 07/27/2021   CL 101 07/27/2021   CO2 22 07/27/2021   Lab Results  Component Value Date   ALT 22 07/27/2021   AST 15 07/27/2021   ALKPHOS 55 07/27/2021   BILITOT 0.7 07/27/2021   Lab Results  Component Value Date   HGBA1C 6.5 (H) 07/27/2021   Lab Results  Component Value Date   INSULIN 32.6 (H) 07/27/2021   Lab Results  Component Value Date   TSH 0.756 07/27/2021   Lab Results  Component Value Date   CHOL 160 07/27/2021   HDL 37 (L) 07/27/2021   LDLCALC 90 07/27/2021   TRIG 194 (H) 07/27/2021   Lab Results  Component Value Date   WBC 12.2 (H) 07/27/2021   HGB 13.2 07/27/2021   HCT 40.7 07/27/2021   MCV 85 07/27/2021   PLT 308 07/27/2021   No results found for: "IRON", "TIBC", "FERRITIN" Lab Results  Component Value Date   VD25OH 17.6 (L) 07/27/2021    Attestation Statements:   Reviewed by clinician on day of visit: allergies, medications, problem list, medical history, surgical history, family history, social history, and previous encounter notes.

## 2021-09-20 ENCOUNTER — Encounter (INDEPENDENT_AMBULATORY_CARE_PROVIDER_SITE_OTHER): Payer: Self-pay | Admitting: Family Medicine

## 2021-09-20 ENCOUNTER — Telehealth (INDEPENDENT_AMBULATORY_CARE_PROVIDER_SITE_OTHER): Payer: 59 | Admitting: Family Medicine

## 2021-09-20 VITALS — BP 119/68 | HR 67 | Temp 98.0°F | Ht 64.0 in | Wt 193.0 lb

## 2021-09-20 DIAGNOSIS — E559 Vitamin D deficiency, unspecified: Secondary | ICD-10-CM

## 2021-09-20 DIAGNOSIS — Z6836 Body mass index (BMI) 36.0-36.9, adult: Secondary | ICD-10-CM

## 2021-09-20 DIAGNOSIS — Z6833 Body mass index (BMI) 33.0-33.9, adult: Secondary | ICD-10-CM | POA: Diagnosis not present

## 2021-09-20 DIAGNOSIS — E669 Obesity, unspecified: Secondary | ICD-10-CM

## 2021-09-20 DIAGNOSIS — E1169 Type 2 diabetes mellitus with other specified complication: Secondary | ICD-10-CM

## 2021-09-20 DIAGNOSIS — Z7985 Long-term (current) use of injectable non-insulin antidiabetic drugs: Secondary | ICD-10-CM

## 2021-09-20 MED ORDER — ERGOCALCIFEROL 1.25 MG (50000 UT) PO CAPS: 50000.0000 [IU] | ORAL_CAPSULE | ORAL | 0 refills | Status: DC

## 2021-09-20 MED ORDER — SEMAGLUTIDE (1 MG/DOSE) 4 MG/3ML ~~LOC~~ SOPN: 1.0000 mg | PEN_INJECTOR | SUBCUTANEOUS | 0 refills | Status: DC

## 2021-09-29 ENCOUNTER — Other Ambulatory Visit (INDEPENDENT_AMBULATORY_CARE_PROVIDER_SITE_OTHER): Payer: Self-pay | Admitting: Family Medicine

## 2021-09-29 DIAGNOSIS — F5089 Other specified eating disorder: Secondary | ICD-10-CM

## 2021-10-08 ENCOUNTER — Other Ambulatory Visit (INDEPENDENT_AMBULATORY_CARE_PROVIDER_SITE_OTHER): Payer: Self-pay | Admitting: Family Medicine

## 2021-10-08 DIAGNOSIS — F5089 Other specified eating disorder: Secondary | ICD-10-CM

## 2021-10-12 ENCOUNTER — Other Ambulatory Visit (INDEPENDENT_AMBULATORY_CARE_PROVIDER_SITE_OTHER): Payer: Self-pay | Admitting: Family Medicine

## 2021-10-12 DIAGNOSIS — F5089 Other specified eating disorder: Secondary | ICD-10-CM

## 2021-10-15 MED ORDER — BUPROPION HCL ER (SR) 200 MG PO TB12: 200.0000 mg | ORAL_TABLET | Freq: Every day | ORAL | 0 refills | Status: DC

## 2021-10-15 NOTE — Telephone Encounter (Signed)
LAST APPOINTMENT DATE: 09/20/21 NEXT APPOINTMENT DATE: 10/17/21   [id number] [place] - [address] [address] [address]Phone: [phone]: [phone] Patient is requesting a refill of the following medications: Pending Prescriptions:                       Disp   Refills   buPROPion (WELLBUTRIN SR) 200 MG 12 hr tab*30 tab*0       Sig: Take 1 tablet (200 mg total) by mouth daily.   Date last filled: 09/06/21 [facility] Lab Results      Component                Value               Date                      HGBA1C                   6.5 (H)             07/27/2021           Lab Results      Component                Value               Date                      LDLCALC                  90                  07/27/2021                CREATININE               0.63                07/27/2021           Lab Results      Component                Value               Date                      VD25OH                   17.6 (L)            07/27/2021            BP Readings from Last 3 Encounters: 09/19/21 : 119/68 09/06/21 : (Marland [id number] : 125/78

## 2021-10-17 ENCOUNTER — Encounter (INDEPENDENT_AMBULATORY_CARE_PROVIDER_SITE_OTHER): Payer: Self-pay | Admitting: Family Medicine

## 2021-10-17 ENCOUNTER — Ambulatory Visit (INDEPENDENT_AMBULATORY_CARE_PROVIDER_SITE_OTHER): Payer: 59 | Admitting: Family Medicine

## 2021-10-17 VITALS — BP 98/60 | HR 66 | Temp 98.0°F | Ht 64.0 in | Wt 191.0 lb

## 2021-10-17 DIAGNOSIS — F5089 Other specified eating disorder: Secondary | ICD-10-CM | POA: Diagnosis not present

## 2021-10-17 DIAGNOSIS — E669 Obesity, unspecified: Secondary | ICD-10-CM

## 2021-10-17 DIAGNOSIS — E1169 Type 2 diabetes mellitus with other specified complication: Secondary | ICD-10-CM | POA: Diagnosis not present

## 2021-10-17 DIAGNOSIS — Z6832 Body mass index (BMI) 32.0-32.9, adult: Secondary | ICD-10-CM

## 2021-10-17 DIAGNOSIS — F509 Eating disorder, unspecified: Secondary | ICD-10-CM | POA: Insufficient documentation

## 2021-10-17 DIAGNOSIS — E559 Vitamin D deficiency, unspecified: Secondary | ICD-10-CM

## 2021-10-17 DIAGNOSIS — Z7985 Long-term (current) use of injectable non-insulin antidiabetic drugs: Secondary | ICD-10-CM

## 2021-10-18 MED ORDER — SEMAGLUTIDE (1 MG/DOSE) 4 MG/3ML ~~LOC~~ SOPN: 1.0000 mg | PEN_INJECTOR | SUBCUTANEOUS | 0 refills | Status: DC

## 2021-10-18 MED ORDER — BUPROPION HCL ER (SR) 200 MG PO TB12: 200.0000 mg | ORAL_TABLET | Freq: Every day | ORAL | 0 refills | Status: DC

## 2021-10-18 NOTE — Progress Notes (Signed)
Chief Complaint:   OBESITY Sherri [name] is here to discuss her progress with her obesity treatment plan along with follow-up of her obesity related diagnoses. Sherri [name] is on keeping a food journal and adhering to recommended goals of 1500 calories and 100+ grams of protein daily and states she is following her eating plan approximately 90% of the time. Sherri [name] states she is doing 0 minutes 0 times per week.  Today's visit was #: 6 Starting weight: 211 lbs Starting date: 07/27/2021 Today's weight: 191 lbs Today's date: 10/17/2021 Total lbs lost to date: 20 Total lbs lost since last in-office visit: 2  Interim History: Sherri [name] has done more traveling and eating out, but she still has been mindful of her food choices.  She is trying to increase her protein but she still struggles at times.  She will be going on vacation again soon.  Subjective:   1. Vitamin D deficiency Sherri [name] is stable on vitamin D, and she is due to have labs checked again soon.  2. Type 2 diabetes mellitus with other specified complication, unspecified whether long term insulin use (HCC) Sherri [name] is working on her diet, exercise, and weight loss.  She is trying to decrease simple carbohydrates and polyphagia is controlled.  3. Other disorder of eating, with emotinal eating Sherri [name] is doing well on her medications and she is able to decrease emotional eating behaviors.  No side effects were noted.  She requests a 90-day refill due to insurance.  Assessment/Plan:   1. Vitamin D deficiency Sherri [name] will continue vitamin D as is, and we will recheck labs in approximately 1 month.  2. Type 2 diabetes mellitus with other specified complication, unspecified whether long term insulin use (Sherri [name]) Sherri [name] will continue Ozempic 1 mg once weekly, and we will refill for 1 month.  We will recheck labs in approximately 1 month.  - Semaglutide, 1 MG/DOSE, 4 MG/3ML SOPN; Inject 1 mg into the skin once a week.  Dispense: 3 mL; Refill:  0  3. Other disorder of eating, with emotinal eating Sherri [name] will continue Wellbutrin SR 200 mg once daily, and we will refill for 90 days.  - buPROPion (WELLBUTRIN SR) 200 MG 12 hr tablet; Take 1 tablet (200 mg total) by mouth daily.  Dispense: 90 tablet; Refill: 0  4. Obesity, Current BMI 32.9 Sherri [name] is currently in the action stage of change. As such, her goal is to continue with weight loss efforts. She has agreed to keeping a food journal and adhering to recommended goals of 1500 calories and 100+ grams of protein daily.   Behavioral modification strategies: increasing lean protein intake, increasing water intake, travel eating strategies, and celebration eating strategies.  Sherri [name] has agreed to follow-up with our clinic in 3 week with [facility], FNP-C, and then in 2 weeks with myself (she is to come fasting). She was informed of the importance of frequent follow-up visits to maximize her success with intensive lifestyle modifications for her multiple health conditions.   Objective:   Blood pressure 98/60, pulse 66, temperature 98 F (36.7 C), height '5\' 4"'$  (1.626 m), weight 191 lb (86.6 kg), SpO2 97 %. Body mass index is 32.79 kg/m.  General: Cooperative, alert, well developed, in no acute distress. HEENT: Conjunctivae and lids unremarkable. Cardiovascular: Regular rhythm.  Lungs: Normal work of breathing. Neurologic: No focal deficits.   Lab Results  Component Value Date   CREATININE 0.63 07/27/2021   BUN 9 07/27/2021   NA 140 07/27/2021   K 4.5  07/27/2021   CL 101 07/27/2021   CO2 22 07/27/2021   Lab Results  Component Value Date   ALT 22 07/27/2021   AST 15 07/27/2021   ALKPHOS 55 07/27/2021   BILITOT 0.7 07/27/2021   Lab Results  Component Value Date   HGBA1C 6.5 (H) 07/27/2021   Lab Results  Component Value Date   INSULIN 32.6 (H) 07/27/2021   Lab Results  Component Value Date   TSH 0.756 07/27/2021   Lab Results  Component Value Date   CHOL  160 07/27/2021   HDL 37 (L) 07/27/2021   LDLCALC 90 07/27/2021   TRIG 194 (H) 07/27/2021   Lab Results  Component Value Date   VD25OH 17.6 (L) 07/27/2021   Lab Results  Component Value Date   WBC 12.2 (H) 07/27/2021   HGB 13.2 07/27/2021   HCT 40.7 07/27/2021   MCV 85 07/27/2021   PLT 308 07/27/2021   No results found for: "IRON", "TIBC", "FERRITIN"  Attestation Statements:   Reviewed by clinician on day of visit: allergies, medications, problem list, medical history, surgical history, family history, social history, and previous encounter notes.   I, Trixie Dredge, am acting as transcriptionist for Dennard Nip, MD.  I have reviewed the above documentation for accuracy and completeness, and I agree with the above. -  Dennard Nip, MD

## 2021-11-06 NOTE — Progress Notes (Unsigned)
TeleHealth Visit:  This visit was completed with telemedicine (audio/video) technology. Patsie has verbally consented to this TeleHealth visit. The patient is located at home, the provider is located at home. The participants in this visit include the listed provider and patient. The visit was conducted today via MyChart video.  OBESITY Tanga is here to discuss her progress with her obesity treatment plan along with follow-up of her obesity related diagnoses.   Today's visit was # 7 Starting weight: 211 lbs Starting date: 07/27/2021 Weight at last in office visit: 191 lbs on 10/17/21 Total weight loss: 20 lbs at last in office visit on 10/17/21. Today's reported weight: 191 lbs   Nutrition Plan: keeping a food journal and adhering to recommended goals of 1500 calories and 100+ gms protein.  Hunger is well controlled. Cravings are well controlled.  Current exercise: none  Interim History: Has not been journaling but has been making good food choices. She has been on a few vacations and has a [place] cruise coming up mid-August. She is happy with maintaining her weight.  She is working on building her repertoire of healthy recipes. She is very good at meal planning for her family a week at a time.   Assessment/Plan:  1. Type II Diabetes HgbA1c is at goal. Last A1c was 6.5 on 07/27/21. DM managed by Dr. Barnet Pall at [facility]. CBGs: Cjecks a few times per week- runs 140-150s non fasting. Episodes of hypoglycemia: no Medication(s): Ozempic 1 mg weekly.  Lab Results  Component Value Date   HGBA1C 6.5 (H) 07/27/2021   Lab Results  Component Value Date   LDLCALC 90 07/27/2021   CREATININE 0.63 07/27/2021    Plan: Continue Ozempic 1 mg weekly.   2. Eating disorder/emotional eating Kyah has had issues with stress/emotional eating. Currently this is well controlled. Overall mood is stable.  Medication(s): bupropion 200 mg daily.  Plan: Continue bupropion 200 mg daily.  3.  Obesity: Current BMI 32.77 Everlena is currently in the action stage of change. As such, her goal is to continue with weight loss efforts.  She has agreed to keeping a food journal and adhering to recommended goals of 1500 calories and 100+ gms protein. Marland Kitchen   Exercise goals: No exercise has been prescribed at this time.  Behavioral modification strategies: increasing lean protein intake, decreasing simple carbohydrates, and meal planning and cooking strategies.  Willene has agreed to follow-up with our clinic in 2 weeks.   No orders of the defined types were placed in this encounter.   There are no discontinued medications.   No orders of the defined types were placed in this encounter.     Objective:   VITALS: Per patient if applicable, see vitals. GENERAL: Alert and in no acute distress. CARDIOPULMONARY: No increased WOB. Speaking in clear sentences.  PSYCH: Pleasant and cooperative. Speech normal rate and rhythm. Affect is appropriate. Insight and judgement are appropriate. Attention is focused, linear, and appropriate.  NEURO: Oriented as arrived to appointment on time with no prompting.   Lab Results  Component Value Date   CREATININE 0.63 07/27/2021   BUN 9 07/27/2021   NA 140 07/27/2021   K 4.5 07/27/2021   CL 101 07/27/2021   CO2 22 07/27/2021   Lab Results  Component Value Date   ALT 22 07/27/2021   AST 15 07/27/2021   ALKPHOS 55 07/27/2021   BILITOT 0.7 07/27/2021   Lab Results  Component Value Date   HGBA1C 6.5 (H) 07/27/2021   Lab Results  Component Value Date   INSULIN 32.6 (H) 07/27/2021   Lab Results  Component Value Date   TSH 0.756 07/27/2021   Lab Results  Component Value Date   CHOL 160 07/27/2021   HDL 37 (L) 07/27/2021   LDLCALC 90 07/27/2021   TRIG 194 (H) 07/27/2021   Lab Results  Component Value Date   WBC 12.2 (H) 07/27/2021   HGB 13.2 07/27/2021   HCT 40.7 07/27/2021   MCV 85 07/27/2021   PLT 308 07/27/2021   No results found  for: "IRON", "TIBC", "FERRITIN" Lab Results  Component Value Date   VD25OH 17.6 (L) 07/27/2021    Attestation Statements:   Reviewed by clinician on day of visit: allergies, medications, problem list, medical history, surgical history, family history, social history, and previous encounter notes.  Time spent on visit including the items listed below was 31 minutes.  -preparing to see the patient (e.g., review of tests, history, previous notes) -obtaining and/or reviewing separately obtained history -counseling and educating the patient/family/caregiver -documenting clinical information in the electronic or other health record

## 2021-11-07 ENCOUNTER — Encounter (INDEPENDENT_AMBULATORY_CARE_PROVIDER_SITE_OTHER): Payer: Self-pay | Admitting: Family Medicine

## 2021-11-07 ENCOUNTER — Telehealth (INDEPENDENT_AMBULATORY_CARE_PROVIDER_SITE_OTHER): Payer: 59 | Admitting: Family Medicine

## 2021-11-07 DIAGNOSIS — F5089 Other specified eating disorder: Secondary | ICD-10-CM

## 2021-11-07 DIAGNOSIS — Z6832 Body mass index (BMI) 32.0-32.9, adult: Secondary | ICD-10-CM | POA: Diagnosis not present

## 2021-11-07 DIAGNOSIS — E669 Obesity, unspecified: Secondary | ICD-10-CM

## 2021-11-07 DIAGNOSIS — E1169 Type 2 diabetes mellitus with other specified complication: Secondary | ICD-10-CM | POA: Diagnosis not present

## 2021-11-07 DIAGNOSIS — Z7985 Long-term (current) use of injectable non-insulin antidiabetic drugs: Secondary | ICD-10-CM

## 2021-11-19 ENCOUNTER — Ambulatory Visit (INDEPENDENT_AMBULATORY_CARE_PROVIDER_SITE_OTHER): Payer: 59 | Admitting: Family Medicine

## 2021-11-21 ENCOUNTER — Encounter (INDEPENDENT_AMBULATORY_CARE_PROVIDER_SITE_OTHER): Payer: Self-pay

## 2021-12-06 NOTE — Progress Notes (Deleted)
TeleHealth Visit:  This visit was completed with telemedicine (audio/video) technology. [name] has verbally consented to this TeleHealth visit. The patient is located at home, the provider is located at home. The participants in this visit include the listed provider and patient. The visit was conducted today via MyChart video.  OBESITY Sherri [name] is here to discuss her progress with her obesity treatment plan along with follow-up of her obesity related diagnoses.   Today's visit was # 8 Starting weight: 211 lbs Starting date: 07/27/2021 Weight at last in office visit: 191 lbs on 10/17/21 Total weight loss: 20 lbs at last in office visit on 10/17/21. Today's reported weight: *** lbs No weight reported.  Nutrition Plan: keeping a food journal and adhering to recommended goals of 1500 calories and 100+ gms protein.  Hunger is {EWCONTROLASSESSMENT:24261}. Cravings are {EWCONTROLASSESSMENT:24261}.  Current exercise: {exercise types:16438}  Interim History: ***  Assessment/Plan:  1. ***  2. ***  3. ***  Obesity: Current BMI *** Sherri [name] {CHL AMB IS/IS NOT:210130109} currently in the action stage of change. As such, her goal is to {MWMwtloss#1:210800005}.  She has agreed to {MWMwtlossportion/plan2:23431}.   Exercise goals: {MWM EXERCISE RECS:23473}  Behavioral modification strategies: {MWMwtlossdietstrategies3:23432}.  Sherri [name] has agreed to follow-up with our clinic in {NUMBER 1-10:22536} weeks.   No orders of the defined types were placed in this encounter.   There are no discontinued medications.   No orders of the defined types were placed in this encounter.     Objective:   VITALS: Per patient if applicable, see vitals. GENERAL: Alert and in no acute distress. CARDIOPULMONARY: No increased WOB. Speaking in clear sentences.  PSYCH: Pleasant and cooperative. Speech normal rate and rhythm. Affect is appropriate. Insight and judgement are appropriate. Attention is  focused, linear, and appropriate.  NEURO: Oriented as arrived to appointment on time with no prompting.   Lab Results  Component Value Date   CREATININE 0.63 07/27/2021   BUN 9 07/27/2021   NA 140 07/27/2021   K 4.5 07/27/2021   CL 101 07/27/2021   CO2 22 07/27/2021   Lab Results  Component Value Date   ALT 22 07/27/2021   AST 15 07/27/2021   ALKPHOS 55 07/27/2021   BILITOT 0.7 07/27/2021   Lab Results  Component Value Date   HGBA1C 6.5 (H) 07/27/2021   Lab Results  Component Value Date   INSULIN 32.6 (H) 07/27/2021   Lab Results  Component Value Date   TSH 0.756 07/27/2021   Lab Results  Component Value Date   CHOL 160 07/27/2021   HDL 37 (L) 07/27/2021   LDLCALC 90 07/27/2021   TRIG 194 (H) 07/27/2021   Lab Results  Component Value Date   WBC 12.2 (H) 07/27/2021   HGB 13.2 07/27/2021   HCT 40.7 07/27/2021   MCV 85 07/27/2021   PLT 308 07/27/2021   No results found for: "IRON", "TIBC", "FERRITIN" Lab Results  Component Value Date   VD25OH 17.6 (L) 07/27/2021    Attestation Statements:   Reviewed by clinician on day of visit: allergies, medications, problem list, medical history, surgical history, family history, social history, and previous encounter notes.  ***(delete if time-based billing not used) Time spent on visit including the items listed below was *** minutes.  -preparing to see the patient (e.g., review of tests, history, previous notes) -obtaining and/or reviewing separately obtained history -counseling and educating the patient/family/caregiver -documenting clinical information in the electronic or other health record -ordering medications, tests, or procedures -independently interpreting results and  communicating results to the patient/ family/caregiver -referring and communicating with other health care professionals  -care coordination

## 2021-12-10 ENCOUNTER — Telehealth (INDEPENDENT_AMBULATORY_CARE_PROVIDER_SITE_OTHER): Payer: 59 | Admitting: Family Medicine

## 2021-12-17 ENCOUNTER — Other Ambulatory Visit (INDEPENDENT_AMBULATORY_CARE_PROVIDER_SITE_OTHER): Payer: Self-pay | Admitting: Family Medicine

## 2021-12-17 DIAGNOSIS — E1169 Type 2 diabetes mellitus with other specified complication: Secondary | ICD-10-CM

## 2021-12-18 ENCOUNTER — Other Ambulatory Visit (INDEPENDENT_AMBULATORY_CARE_PROVIDER_SITE_OTHER): Payer: Self-pay | Admitting: Family Medicine

## 2021-12-18 ENCOUNTER — Encounter (INDEPENDENT_AMBULATORY_CARE_PROVIDER_SITE_OTHER): Payer: Self-pay | Admitting: Family Medicine

## 2021-12-18 DIAGNOSIS — E1169 Type 2 diabetes mellitus with other specified complication: Secondary | ICD-10-CM

## 2021-12-25 ENCOUNTER — Ambulatory Visit (INDEPENDENT_AMBULATORY_CARE_PROVIDER_SITE_OTHER): Payer: 59 | Admitting: Bariatrics

## 2021-12-25 ENCOUNTER — Encounter: Payer: Self-pay | Admitting: Bariatrics

## 2021-12-25 VITALS — BP 110/71 | HR 66 | Temp 98.0°F | Ht 64.0 in | Wt 187.0 lb

## 2021-12-25 DIAGNOSIS — E1169 Type 2 diabetes mellitus with other specified complication: Secondary | ICD-10-CM

## 2021-12-25 DIAGNOSIS — Z6832 Body mass index (BMI) 32.0-32.9, adult: Secondary | ICD-10-CM

## 2021-12-25 DIAGNOSIS — E669 Obesity, unspecified: Secondary | ICD-10-CM | POA: Diagnosis not present

## 2021-12-25 DIAGNOSIS — E559 Vitamin D deficiency, unspecified: Secondary | ICD-10-CM

## 2021-12-25 DIAGNOSIS — E65 Localized adiposity: Secondary | ICD-10-CM

## 2021-12-25 DIAGNOSIS — Z7985 Long-term (current) use of injectable non-insulin antidiabetic drugs: Secondary | ICD-10-CM

## 2021-12-25 MED ORDER — ERGOCALCIFEROL 1.25 MG (50000 UT) PO CAPS: 50000.0000 [IU] | ORAL_CAPSULE | ORAL | 0 refills | Status: DC

## 2022-01-01 NOTE — Progress Notes (Signed)
Chief Complaint:   OBESITY Sherri [name] is here to discuss her progress with her obesity treatment plan along with follow-up of her obesity related diagnoses. Sherri [name] is on keeping a food journal and adhering to recommended goals of 1500 calories and 100+ grams of protein daily and states she is following her eating plan approximately 50% of the time. Sherri [name] states she is doing 0 minutes 0 times per week.  Today's visit was #: 8 Starting weight: 211 lbs Starting date: 07/27/2021 Today's weight: 187 lbs Today's date: 12/25/2021 Total lbs lost to date: 24 Total lbs lost since last in-office visit: 4  Interim History: Sherri [name] is down 4 lbs since her last visit. She has had some celebrations.   Subjective:   1. Type 2 diabetes mellitus with other specified complication, unspecified whether long term insulin use ([place]) Janney's Endocrinologist has prescribed her Ozempic.   2. Vitamin D deficiency Sherri [name] is taking Vitamin D currently.   3. Abdominal pannus Sherri [name] has potential for irritation on the abdomen. She notes soreness to the touch, and has been drained in the past per OBGYN.   Assessment/Plan:   1. Type 2 diabetes mellitus with other specified complication, unspecified whether long term insulin use ([place]) Sherri [name] will continue Ozempic as directed by her Endocrinologist.   2. Vitamin D deficiency Sherri [name] will continue prescription Vitamin D 50,000 IU every week, and we will refill for 90 days.    - ergocalciferol (VITAMIN D2) 1.25 MG (50000 UT) capsule; Take 1 capsule (50,000 Units total) by mouth once a week.  Dispense: 12 capsule; Refill: 0  3. Abdominal pannus Shraddha will consider plastic surgery referral in the future.   4. Obesity, Current BMI 32.1 Sherri [name] is currently in the action stage of change. As such, her goal is to continue with weight loss efforts. She has agreed to keeping a food journal and adhering to recommended goals of 1500 calories and 100 grams of  protein daily.   We will recheck fasting labs at her next visit. She will get back on the plan 80-90%. Mindful eating was discussed. Decrease carbohydrates and keep protein high.   Exercise goals: will begin walking with small weights.   Behavioral modification strategies: increasing lean protein intake, decreasing simple carbohydrates, increasing vegetables, increasing water intake, decreasing eating out, no skipping meals, meal planning and cooking strategies, keeping healthy foods in the home, and planning for success.  Sherri [name] has agreed to follow-up with our clinic in 4 weeks. She was informed of the importance of frequent follow-up visits to maximize her success with intensive lifestyle modifications for her multiple health conditions.   Objective:   Blood pressure 110/71, pulse 66, temperature 98 F (36.7 C), height '5\' 4"'$  (1.626 m), weight 187 lb (84.8 kg), SpO2 99 %. Body mass index is 32.1 kg/m.  General: Cooperative, alert, well developed, in no acute distress. HEENT: Conjunctivae and lids unremarkable. Cardiovascular: Regular rhythm.  Lungs: Normal work of breathing. Neurologic: No focal deficits.   Lab Results  Component Value Date   CREATININE 0.63 07/27/2021   BUN 9 07/27/2021   NA 140 07/27/2021   K 4.5 07/27/2021   CL 101 07/27/2021   CO2 22 07/27/2021   Lab Results  Component Value Date   ALT 22 07/27/2021   AST 15 07/27/2021   ALKPHOS 55 07/27/2021   BILITOT 0.7 07/27/2021   Lab Results  Component Value Date   HGBA1C 6.5 (H) 07/27/2021   Lab Results  Component Value Date  INSULIN 32.6 (H) 07/27/2021   Lab Results  Component Value Date   TSH 0.756 07/27/2021   Lab Results  Component Value Date   CHOL 160 07/27/2021   HDL 37 (L) 07/27/2021   LDLCALC 90 07/27/2021   TRIG 194 (H) 07/27/2021   Lab Results  Component Value Date   VD25OH 17.6 (L) 07/27/2021   Lab Results  Component Value Date   WBC 12.2 (H) 07/27/2021   HGB 13.2 07/27/2021    HCT 40.7 07/27/2021   MCV 85 07/27/2021   PLT 308 07/27/2021   No results found for: "IRON", "TIBC", "FERRITIN"  Attestation Statements:   Reviewed by clinician on day of visit: allergies, medications, problem list, medical history, surgical history, family history, social history, and previous encounter notes.   Wilhemena Durie, am acting as Location manager for [employer], DO.  I have reviewed the above documentation for accuracy and completeness, and I agree with the above. Jearld Lesch, DO

## 2022-01-02 ENCOUNTER — Encounter: Payer: Self-pay | Admitting: Bariatrics

## 2022-01-22 ENCOUNTER — Ambulatory Visit: Payer: 59 | Admitting: Bariatrics

## 2022-01-22 ENCOUNTER — Ambulatory Visit (INDEPENDENT_AMBULATORY_CARE_PROVIDER_SITE_OTHER): Payer: 59 | Admitting: Nurse Practitioner

## 2022-01-22 ENCOUNTER — Encounter: Payer: Self-pay | Admitting: Nurse Practitioner

## 2022-01-22 VITALS — BP 111/75 | HR 61 | Temp 98.4°F | Ht 64.0 in | Wt 190.0 lb

## 2022-01-22 DIAGNOSIS — E669 Obesity, unspecified: Secondary | ICD-10-CM

## 2022-01-22 DIAGNOSIS — E782 Mixed hyperlipidemia: Secondary | ICD-10-CM

## 2022-01-22 DIAGNOSIS — F5089 Other specified eating disorder: Secondary | ICD-10-CM

## 2022-01-22 DIAGNOSIS — Z7985 Long-term (current) use of injectable non-insulin antidiabetic drugs: Secondary | ICD-10-CM

## 2022-01-22 DIAGNOSIS — E1169 Type 2 diabetes mellitus with other specified complication: Secondary | ICD-10-CM

## 2022-01-22 DIAGNOSIS — E559 Vitamin D deficiency, unspecified: Secondary | ICD-10-CM | POA: Diagnosis not present

## 2022-01-22 DIAGNOSIS — Z6832 Body mass index (BMI) 32.0-32.9, adult: Secondary | ICD-10-CM

## 2022-01-22 MED ORDER — ERGOCALCIFEROL 1.25 MG (50000 UT) PO CAPS: 50000.0000 [IU] | ORAL_CAPSULE | ORAL | 0 refills | Status: DC

## 2022-01-22 MED ORDER — BUPROPION HCL ER (SR) 200 MG PO TB12: 200.0000 mg | ORAL_TABLET | Freq: Two times a day (BID) | ORAL | 0 refills | Status: DC

## 2022-01-24 DIAGNOSIS — E782 Mixed hyperlipidemia: Secondary | ICD-10-CM | POA: Insufficient documentation

## 2022-01-24 LAB — INSULIN, RANDOM: INSULIN: 25.8 u[IU]/mL — ABNORMAL HIGH (ref 2.6–24.9)

## 2022-01-24 LAB — COMPREHENSIVE METABOLIC PANEL
ALT: 20 IU/L (ref 0–32)
AST: 15 IU/L (ref 0–40)
Albumin/Globulin Ratio: 2 (ref 1.2–2.2)
Albumin: 4.7 g/dL (ref 3.9–4.9)
Alkaline Phosphatase: 53 IU/L (ref 44–121)
BUN/Creatinine Ratio: 16 (ref 9–23)
BUN: 10 mg/dL (ref 6–24)
Bilirubin Total: 0.5 mg/dL (ref 0.0–1.2)
CO2: 23 mmol/L (ref 20–29)
Calcium: 9.2 mg/dL (ref 8.7–10.2)
Chloride: 102 mmol/L (ref 96–106)
Creatinine, Ser: 0.63 mg/dL (ref 0.57–1.00)
Globulin, Total: 2.4 g/dL (ref 1.5–4.5)
Glucose: 111 mg/dL — ABNORMAL HIGH (ref 70–99)
Potassium: 4.5 mmol/L (ref 3.5–5.2)
Sodium: 139 mmol/L (ref 134–144)
Total Protein: 7.1 g/dL (ref 6.0–8.5)
eGFR: 109 mL/min/{1.73_m2} (ref >59)

## 2022-01-24 LAB — LIPID PANEL WITH LDL/HDL RATIO
Cholesterol, Total: 152 mg/dL (ref 100–199)
HDL: 46 mg/dL (ref >39)
LDL Chol Calc (NIH): 88 mg/dL (ref 0–99)
LDL/HDL Ratio: 1.9 ratio (ref 0.0–3.2)
Triglycerides: 99 mg/dL (ref 0–149)
VLDL Cholesterol Cal: 18 mg/dL (ref 5–40)

## 2022-01-24 LAB — VITAMIN D 25 HYDROXY (VIT D DEFICIENCY, FRACTURES): Vit D, 25-Hydroxy: 62.5 ng/mL (ref 30.0–100.0)

## 2022-01-24 LAB — HEMOGLOBIN A1C
Est. average glucose Bld gHb Est-mCnc: 117 mg/dL
Hgb A1c MFr Bld: 5.7 % — ABNORMAL HIGH (ref 4.8–5.6)

## 2022-01-24 NOTE — Progress Notes (Unsigned)
Chief Complaint:   OBESITY Sherri [name] is here to discuss her progress with her obesity treatment plan along with follow-up of her obesity related diagnoses. Sherri [name] is on the Category 2 Plan and states she is following her eating plan approximately 60% of the time. Early states she is walking 15-20 minutes 7 times per week.  Today's visit was #: 7 Starting weight: 211 lbs Starting date: 07/27/2021 Today's weight: 190 lbs Today's date: 01/22/2022 Total lbs lost to date: 21 lbs Total lbs lost since last in-office visit: 1  Interim History: [name] has gotten off track since her last visit. Struggling with cravings since stopping Ozempic. Does well with breakfast and coffee. Skipping lunch and struggles with dinner. Drinking water daily and denies sugary drinks.   Subjective:   1. Type 2 diabetes mellitus with other specified complication, unspecified whether long term insulin use (HCC) Josanna is seeing Endo on the regular basis. She is unable to find Ozempic due to shortage. She was switched to [facility] yesterday but unable to obtain.  Tried Metformin but stopped due to GI side effects. On statin, last eye exam: 2022, fasting blood sugars-135-155.  2. Vitamin D deficiency Sherri [name] is currently taking prescription Vit D 50,000 IU once a week.  Denies side effects.  Denies nausea, vomiting or muscle weakness.  3. Mixed hyperlipidemia Sherri [name] is taking Crestor 20 mg. She denies side effects. She has a family history of Hyperlipidemia in her Father.   4. Other disorder of eating, with emotinal eating She is taking Wellbutrin SR 200 mg. Mavery denies  side effects and feels the Wellbutrin is helping with appetite, cravings and emotional eating but is still struggling with cravings, especially in the evenings.   Assessment/Plan:   1. Type 2 diabetes mellitus with other specified complication, unspecified whether long term insulin use (HCC) We will obtain labs today. Continue to follow  up with Endo and take Mounjaro as directed.   - Comprehensive metabolic panel - Hemoglobin A1c - Insulin, random  2. Vitamin D deficiency We will obtain labs today. We will refill Vit D 50,000 IU once a week for 1 month with 0 refills. Low Vitamin D level contributes to fatigue and are associated with obesity, breast, and colon cancer. She agrees to continue to take prescription Vitamin D '@50'$ ,000 IU every week and will follow-up for routine testing of Vitamin D, at least 2-3 times per year to avoid over-replacement.   -Refill ergocalciferol (VITAMIN D2) 1.25 MG (50000 UT) capsule; Take 1 capsule (50,000 Units total) by mouth once a week.  Dispense: 12 capsule; Refill: 0  - Comprehensive metabolic panel - VITAMIN D 25 Hydroxy (Vit-D Deficiency, Fractures)  3. Mixed hyperlipidemia We will obtain labs today. Cardiovascular risk and specific lipid/LDL goals reviewed.  We discussed several lifestyle modifications today and Sherri [name] will continue to work on diet, exercise and weight loss efforts. Orders and follow up as documented in patient record.   Counseling Intensive lifestyle modifications are the first line treatment for this issue. Dietary changes: Increase soluble fiber. Decrease simple carbohydrates. Exercise changes: Moderate to vigorous-intensity aerobic activity 150 minutes per week if tolerated. Lipid-lowering medications: see documented in medical record.   - Comprehensive metabolic panel - Lipid Panel With LDL/HDL Ratio  4. Other disorder of eating, with emotinal eating We will refill Wellbutrin SR 200 mg once daily for 1 month with 0 refills.  -Refill buPROPion (WELLBUTRIN SR) 200 MG 12 hr tablet; Take 1 tablet (200 mg total) by mouth 2 (  two) times daily.  Dispense: 180 tablet; Refill: 0  5. Obesity, Current BMI 32.1 [name] is currently in the action stage of change. As such, her goal is to continue with weight loss efforts. She has agreed to keeping a food journal and  adhering to recommended goals of 1500 calories and 100+g protein.   Exercise goals: All adults should avoid inactivity. Some physical activity is better than none, and adults who participate in any amount of physical activity gain some health benefits.  Behavioral modification strategies: increasing lean protein intake, increasing vegetables, and increasing water intake.  Sherri [name] has agreed to follow-up with our clinic in 4 weeks. She was informed of the importance of frequent follow-up visits to maximize her success with intensive lifestyle modifications for her multiple health conditions.   Sherri [name] was informed we would discuss her lab results at her next visit unless there is a critical issue that needs to be addressed sooner. Sherri [name] agreed to keep her next visit at the agreed upon time to discuss these results.  Objective:   Blood pressure 111/75, pulse 61, temperature 98.4 F (36.9 C), temperature source Oral, height '5\' 4"'$  (1.626 m), weight 190 lb (86.2 kg), SpO2 97 %. Body mass index is 32.61 kg/m.  General: Cooperative, alert, well developed, in no acute distress. HEENT: Conjunctivae and lids unremarkable. Cardiovascular: Regular rhythm.  Lungs: Normal work of breathing. Neurologic: No focal deficits.   Lab Results  Component Value Date   CREATININE 0.63 01/22/2022   BUN 10 01/22/2022   NA 139 01/22/2022   K 4.5 01/22/2022   CL 102 01/22/2022   CO2 23 01/22/2022   Lab Results  Component Value Date   ALT 20 01/22/2022   AST 15 01/22/2022   ALKPHOS 53 01/22/2022   BILITOT 0.5 01/22/2022   Lab Results  Component Value Date   HGBA1C 5.7 (H) 01/22/2022   HGBA1C 6.5 (H) 07/27/2021   Lab Results  Component Value Date   INSULIN 25.8 (H) 01/22/2022   INSULIN 32.6 (H) 07/27/2021   Lab Results  Component Value Date   TSH 0.756 07/27/2021   Lab Results  Component Value Date   CHOL 152 01/22/2022   HDL 46 01/22/2022   LDLCALC 88 01/22/2022   TRIG 99 01/22/2022    Lab Results  Component Value Date   VD25OH 62.5 01/22/2022   VD25OH 17.6 (L) 07/27/2021   Lab Results  Component Value Date   WBC 12.2 (H) 07/27/2021   HGB 13.2 07/27/2021   HCT 40.7 07/27/2021   MCV 85 07/27/2021   PLT 308 07/27/2021   No results found for: "IRON", "TIBC", "FERRITIN"  Attestation Statements:   Reviewed by clinician on day of visit: allergies, medications, problem list, medical history, surgical history, family history, social history, and previous encounter notes.  I, Jerrel Ivory, RMA, am acting as Location manager for Everardo Pacific, FNP.  I have reviewed the above documentation for accuracy and completeness, and I agree with the above. Everardo Pacific, FNP

## 2022-02-12 ENCOUNTER — Other Ambulatory Visit (INDEPENDENT_AMBULATORY_CARE_PROVIDER_SITE_OTHER): Payer: Self-pay | Admitting: Family Medicine

## 2022-02-12 DIAGNOSIS — F5089 Other specified eating disorder: Secondary | ICD-10-CM

## 2022-02-19 ENCOUNTER — Encounter: Payer: Self-pay | Admitting: Nurse Practitioner

## 2022-02-19 ENCOUNTER — Ambulatory Visit (INDEPENDENT_AMBULATORY_CARE_PROVIDER_SITE_OTHER): Payer: 59 | Admitting: Nurse Practitioner

## 2022-02-19 VITALS — BP 111/74 | HR 64 | Temp 98.1°F | Ht 64.0 in | Wt 191.0 lb

## 2022-02-19 DIAGNOSIS — E559 Vitamin D deficiency, unspecified: Secondary | ICD-10-CM | POA: Diagnosis not present

## 2022-02-19 DIAGNOSIS — E1169 Type 2 diabetes mellitus with other specified complication: Secondary | ICD-10-CM | POA: Diagnosis not present

## 2022-02-19 DIAGNOSIS — E669 Obesity, unspecified: Secondary | ICD-10-CM | POA: Diagnosis not present

## 2022-02-19 DIAGNOSIS — Z6832 Body mass index (BMI) 32.0-32.9, adult: Secondary | ICD-10-CM

## 2022-02-19 DIAGNOSIS — F5089 Other specified eating disorder: Secondary | ICD-10-CM | POA: Diagnosis not present

## 2022-02-19 DIAGNOSIS — Z7985 Long-term (current) use of injectable non-insulin antidiabetic drugs: Secondary | ICD-10-CM

## 2022-02-19 NOTE — Patient Instructions (Signed)

## 2022-02-25 NOTE — Progress Notes (Unsigned)
Chief Complaint:   OBESITY Sherri [name] is here to discuss her progress with her obesity treatment plan along with follow-up of her obesity related diagnoses. Sherri [name] is on keeping a food journal and adhering to recommended goals of 1500 calories and 100+ grams of protein and CAT 2 and states she is following her eating plan approximately 50% of the time. Sherri [name] states she is walking 30 minutes 7 times per week.  Today's visit was #: 8 Starting weight: 211 lbs Starting date: 07/27/2021 Today's weight: 191 lbs Today's date: 02/19/2022 Total lbs lost to date: 20 lbs Total lbs lost since last in-office visit: 0  Interim History: Sherri [name] struggles with Lose it app. Breakfast is protein cereal with Fairlife milk+coffee or yogurt or eggs in wrap. Lunch is skipped or [place] jerky, [place]+perm wisps. Aiming to eat protein. Dinner: makes dinner nightly and feels like this is where she goes wrong. She does not feel she is eating enough protein.  Subjective:   1. Type 2 diabetes mellitus with other specified complication, unspecified whether long term insulin use ([place]) Labs discussed during visit today. Sherri [name] is taking Mounjaro 5 mg (took first dose 2 days ago). Denies any side effects. She is not checking blood sugars at home. Denies hypoglycemia.   2. Other disorder of eating, with emotinal eating Labs discussed during visit today. Sherri [name] is taking Wellbutrin SR 200 mg. Tried to increase to twice a day and could not take/tolerate second dose due to side effects of insomnia. Denies cravings.  3. Vitamin D deficiency Labs discussed during visit today. Sherri [name] is currently taking prescription Vit D 50,000 IU once a week. Denies any nausea, vomiting or muscle weakness.  Assessment/Plan:   1. Type 2 diabetes mellitus with other specified complication, unspecified whether long term insulin use ([place]) Continue with taking Mounjaro as directed. Continue to follow up with Endo.  Good blood  sugar control is important to decrease the likelihood of diabetic complications such as nephropathy, neuropathy, limb loss, blindness, coronary artery disease, and death. Intensive lifestyle modification including diet, exercise and weight loss are the first line of treatment for diabetes.    2. Other disorder of eating, with emotinal eating Continue taking Wellbutrin SR 200 mg once daily.  Side effects discussed.    3. Vitamin D deficiency Take Vit D 50,000 IU once every 2 weeks.  Low Vitamin D level contributes to fatigue and are associated with obesity, breast, and colon cancer. She agrees to continue to take prescription Vitamin D '@50'$ ,000 IU every 2 weeks and will follow-up for routine testing of Vitamin D, at least 2-3 times per year to avoid over-replacement.   4. Obesity, Current BMI 32.9 Sherri [name] is currently in the action stage of change. As such, her goal is to continue with weight loss efforts. She has agreed to keeping a food journal and adhering to recommended goals of 1500 calories and 100 grams of protein.   Exercise goals: As is. Adding in resistance training.  Behavioral modification strategies: increasing lean protein intake, increasing vegetables, increasing water intake, no skipping meals, and keeping a strict food journal.  Sherri [name] has agreed to follow-up with our clinic in 7 weeks. She was informed of the importance of frequent follow-up visits to maximize her success with intensive lifestyle modifications for her multiple health conditions.   Objective:   Blood pressure 111/74, pulse 64, temperature 98.1 F (36.7 C), temperature source Oral, height '5\' 4"'$  (1.626 m), weight 191 lb (86.6 kg), SpO2 95 %.  Body mass index is 32.79 kg/m.  General: Cooperative, alert, well developed, in no acute distress. HEENT: Conjunctivae and lids unremarkable. Cardiovascular: Regular rhythm.  Lungs: Normal work of breathing. Neurologic: No focal deficits.   Lab Results  Component  Value Date   CREATININE 0.63 01/22/2022   BUN 10 01/22/2022   NA 139 01/22/2022   K 4.5 01/22/2022   CL 102 01/22/2022   CO2 23 01/22/2022   Lab Results  Component Value Date   ALT 20 01/22/2022   AST 15 01/22/2022   ALKPHOS 53 01/22/2022   BILITOT 0.5 01/22/2022   Lab Results  Component Value Date   HGBA1C 5.7 (H) 01/22/2022   HGBA1C 6.5 (H) 07/27/2021   Lab Results  Component Value Date   INSULIN 25.8 (H) 01/22/2022   INSULIN 32.6 (H) 07/27/2021   Lab Results  Component Value Date   TSH 0.756 07/27/2021   Lab Results  Component Value Date   CHOL 152 01/22/2022   HDL 46 01/22/2022   LDLCALC 88 01/22/2022   TRIG 99 01/22/2022   Lab Results  Component Value Date   VD25OH 62.5 01/22/2022   VD25OH 17.6 (L) 07/27/2021   Lab Results  Component Value Date   WBC 12.2 (H) 07/27/2021   HGB 13.2 07/27/2021   HCT 40.7 07/27/2021   MCV 85 07/27/2021   PLT 308 07/27/2021   No results found for: "IRON", "TIBC", "FERRITIN"  Attestation Statements:   Reviewed by clinician on day of visit: allergies, medications, problem list, medical history, surgical history, family history, social history, and previous encounter notes.  Time spent on visit including pre-visit chart review and post-visit care and charting was 30 minutes.   I, Brendell Tyus, RMA, am acting as transcriptionist for Everardo Pacific, FNP.  I have reviewed the above documentation for accuracy and completeness, and I agree with the above. Everardo Pacific, FNP

## 2022-04-10 ENCOUNTER — Ambulatory Visit (INDEPENDENT_AMBULATORY_CARE_PROVIDER_SITE_OTHER): Payer: 59 | Admitting: Nurse Practitioner

## 2022-04-10 ENCOUNTER — Encounter: Payer: Self-pay | Admitting: Nurse Practitioner

## 2022-04-10 VITALS — BP 120/76 | HR 67 | Temp 98.2°F | Ht 64.0 in | Wt 188.0 lb

## 2022-04-10 DIAGNOSIS — E669 Obesity, unspecified: Secondary | ICD-10-CM | POA: Diagnosis not present

## 2022-04-10 DIAGNOSIS — G2581 Restless legs syndrome: Secondary | ICD-10-CM | POA: Diagnosis not present

## 2022-04-10 DIAGNOSIS — F5089 Other specified eating disorder: Secondary | ICD-10-CM | POA: Diagnosis not present

## 2022-04-10 DIAGNOSIS — E559 Vitamin D deficiency, unspecified: Secondary | ICD-10-CM

## 2022-04-10 DIAGNOSIS — Z6832 Body mass index (BMI) 32.0-32.9, adult: Secondary | ICD-10-CM

## 2022-04-10 MED ORDER — BUPROPION HCL ER (SR) 200 MG PO TB12: 200.0000 mg | ORAL_TABLET | Freq: Two times a day (BID) | ORAL | 0 refills | Status: DC

## 2022-04-10 MED ORDER — ROPINIROLE HCL 0.25 MG PO TABS: 0.2500 mg | ORAL_TABLET | Freq: Every day | ORAL | 0 refills | Status: DC

## 2022-04-10 MED ORDER — ERGOCALCIFEROL 1.25 MG (50000 UT) PO CAPS: 50000.0000 [IU] | ORAL_CAPSULE | ORAL | 0 refills | Status: DC

## 2022-04-23 NOTE — Progress Notes (Signed)
Chief Complaint:   OBESITY Sherri [name] is here to discuss her progress with her obesity treatment plan along with follow-up of her obesity related diagnoses. Sherri [name] is on keeping a food journal and adhering to recommended goals of 1500 calories and 100 grams og protein and states she is following her eating plan approximately 0% of the time. Sherri [name] states she is walking 30 minutes 4-5 times per week.  Today's visit was #: 9 Starting weight: 211 lbs Starting date: 07/27/2021 Today's weight: 188 lbs Today's date: 04/10/2022 Total lbs lost to date: 23 lbs Total lbs lost since last in-office visit: 3  Interim History: Sherri [name] has done well with weight loss since her last visit.  Notes has lost more and gained some back due to not eating enough protein.  She has since gotten back on track.  Limiting water due to incontinence, drinking a protein shake daily.  Her highest weight was 232 lbs.  Subjective:   1. Vitamin D deficiency Sherri [name] is currently taking prescription Vit D 50,000 IU every other week. Denies any nausea, vomiting or muscle weakness.  2. RLS (restless legs syndrome) Sherri [name] took Requip in the past and did well. Denied any side effects while taking it.  Is now struggling with PLMS and is waking up at night.  She is requesting to restart Requip.  3. Other disorder of eating, with emotinal eating Sherri [name] is taking Wellbutrin SR 200 mg once daily. Denies any side effects.   Assessment/Plan:   1. Vitamin D deficiency We will refill Vit D 50K IU once a week for 3 months with 0 refills.  Side effects discussed.   -Refill ergocalciferol (VITAMIN D2) 1.25 MG (50000 UT) capsule; Take 1 capsule (50,000 Units total) by mouth once a week.  Dispense: 12 capsule; Refill: 0  2. Other disorder of eating, with emotinal eating We will refill Wellbutrin SR 200 mg daily with option of taking twice a day for 3 months with 0 refills. Side effects disscussed.   -Refill buPROPion  (WELLBUTRIN SR) 200 MG 12 hr tablet; Take 1 tablet (200 mg total) by mouth 2 (two) times daily.  Dispense: 180 tablet; Refill: 0  3. RLS (restless legs syndrome) Start Requip 0.'25mg'$  daily with o refills. Side effects discussed.   -Start rOPINIRole (REQUIP) 0.25 MG tablet; Take 1 tablet (0.25 mg total) by mouth at bedtime.  Dispense: 30 tablet; Refill: 0  4. Obesity, Current BMI 32.4 Sherri [name] is currently in the action stage of change. As such, her goal is to continue with weight loss efforts. She has agreed to keeping a food journal and adhering to recommended goals of 1500 calories and 100 grams of protein.   Exercise goals: All adults should avoid inactivity. Some physical activity is better than none, and adults who participate in any amount of physical activity gain some health benefits.  Behavioral modification strategies: increasing lean protein intake and increasing water intake.  Sherri [name] has agreed to follow-up with our clinic in 6 weeks. She was informed of the importance of frequent follow-up visits to maximize her success with intensive lifestyle modifications for her multiple health conditions.   Objective:   Blood pressure 120/76, pulse 67, temperature 98.2 F (36.8 C), height '5\' 4"'$  (1.626 m), weight 188 lb (85.3 kg), SpO2 99 %. Body mass index is 32.27 kg/m.  General: Cooperative, alert, well developed, in no acute distress. HEENT: Conjunctivae and lids unremarkable. Cardiovascular: Regular rhythm.  Lungs: Normal work of breathing. Neurologic: No focal deficits.  Lab Results  Component Value Date   CREATININE 0.63 01/22/2022   BUN 10 01/22/2022   NA 139 01/22/2022   K 4.5 01/22/2022   CL 102 01/22/2022   CO2 23 01/22/2022   Lab Results  Component Value Date   ALT 20 01/22/2022   AST 15 01/22/2022   ALKPHOS 53 01/22/2022   BILITOT 0.5 01/22/2022   Lab Results  Component Value Date   HGBA1C 5.7 (H) 01/22/2022   HGBA1C 6.5 (H) 07/27/2021   Lab Results   Component Value Date   INSULIN 25.8 (H) 01/22/2022   INSULIN 32.6 (H) 07/27/2021   Lab Results  Component Value Date   TSH 0.756 07/27/2021   Lab Results  Component Value Date   CHOL 152 01/22/2022   HDL 46 01/22/2022   LDLCALC 88 01/22/2022   TRIG 99 01/22/2022   Lab Results  Component Value Date   VD25OH 62.5 01/22/2022   VD25OH 17.6 (L) 07/27/2021   Lab Results  Component Value Date   WBC 12.2 (H) 07/27/2021   HGB 13.2 07/27/2021   HCT 40.7 07/27/2021   MCV 85 07/27/2021   PLT 308 07/27/2021   No results found for: "IRON", "TIBC", "FERRITIN"  Attestation Statements:   Reviewed by clinician on day of visit: allergies, medications, problem list, medical history, surgical history, family history, social history, and previous encounter notes.  I, Brendell Tyus, RMA, am acting as transcriptionist for Everardo Pacific, FNP.  I have reviewed the above documentation for accuracy and completeness, and I agree with the above. Everardo Pacific, FNP

## 2022-05-02 ENCOUNTER — Other Ambulatory Visit: Payer: Self-pay | Admitting: Nurse Practitioner

## 2022-05-02 DIAGNOSIS — G2581 Restless legs syndrome: Secondary | ICD-10-CM

## 2022-05-22 ENCOUNTER — Encounter: Payer: Self-pay | Admitting: Nurse Practitioner

## 2022-05-22 ENCOUNTER — Ambulatory Visit: Payer: Managed Care, Other (non HMO) | Admitting: Nurse Practitioner

## 2022-05-22 VITALS — BP 108/72 | HR 64 | Temp 97.4°F | Ht 64.0 in | Wt 177.0 lb

## 2022-05-22 DIAGNOSIS — E669 Obesity, unspecified: Secondary | ICD-10-CM | POA: Diagnosis not present

## 2022-05-22 DIAGNOSIS — E559 Vitamin D deficiency, unspecified: Secondary | ICD-10-CM

## 2022-05-22 DIAGNOSIS — G2581 Restless legs syndrome: Secondary | ICD-10-CM

## 2022-05-22 DIAGNOSIS — Z6830 Body mass index (BMI) 30.0-30.9, adult: Secondary | ICD-10-CM

## 2022-05-22 MED ORDER — ERGOCALCIFEROL 1.25 MG (50000 UT) PO CAPS: 50000.0000 [IU] | ORAL_CAPSULE | ORAL | 0 refills | Status: DC

## 2022-05-22 MED ORDER — ROPINIROLE HCL 0.25 MG PO TABS: 0.2500 mg | ORAL_TABLET | Freq: Every day | ORAL | 0 refills | Status: DC

## 2022-05-22 NOTE — Progress Notes (Signed)
Office: [phone]  /  Fax: [phone]  WEIGHT SUMMARY AND BIOMETRICS  Medical Weight Loss Height: '5\' 4"'$  (1.626 m) Weight: 177 lb (80.3 kg) Temp: (!) 97.4 F (36.3 C) Pulse Rate: 64 BP: 108/72 SpO2: 100 % Today's Visit #: 10 Weight at Last VIsit: 188lb Weight Lost Since Last Visit: 11lb  Body Fat %: 35.7 % Fat Mass (lbs): 63.4 lbs Muscle Mass (lbs): 108.2 lbs Total Body Water (lbs): 76 lbs Visceral Fat Rating : 8 Starting Date: 07/27/21 Starting Weight: 211lb Total Weight Loss (lbs): 34 lb (15.4 kg)    HPI  Chief Complaint: OBESITY  Sherri [name] is here to discuss her progress with her obesity treatment plan. She is on the keeping a food journal and adhering to recommended goals of 1500 calories and increased protein and states she is following her eating plan approximately 95 % of the time. She states she is exercising 0 minutes 0 times per week.   Interval History:  Since last office visit she has done well with weight loss. She is meeting her protein goals-aiming for 100 grams daily.  Denies hunger and cravings.  She is drinking fairlife milk and water.   Her highest weight was 232 lbs.    Pharmacotherapy: taking Mounjaro prescribed by Endo and Wellbutrin SR '200mg'$  for cravings.  Denies side effects.  Helps with cravings.    Current or previous pharmacotherapy: Buproprion and GLP-1 + GIP   Taking Mounjaro prescribed by Endo.   PHYSICAL EXAM:  Blood pressure 108/72, pulse 64, temperature (!) 97.4 F (36.3 C), height '5\' 4"'$  (1.626 m), weight 177 lb (80.3 kg), SpO2 100 %. Body mass index is 30.38 kg/m.  General: She is overweight, cooperative, alert, well developed, and in no acute distress. PSYCH: Has normal mood, affect and thought process.   Extremities: No edema.  Neurologic: No gross sensory or motor deficits. No tremors or fasciculations noted.    DIAGNOSTIC DATA REVIEWED:  BMET    Component Value Date/Time   NA 139 01/22/2022 1213   K 4.5 01/22/2022  1213   CL 102 01/22/2022 1213   CO2 23 01/22/2022 1213   GLUCOSE 111 (H) 01/22/2022 1213   GLUCOSE 132 (H) 03/12/2014 0500   BUN 10 01/22/2022 1213   CREATININE 0.63 01/22/2022 1213   CREATININE 0.59 07/15/2013 1309   CALCIUM 9.2 01/22/2022 1213   GFRNONAA >90 03/12/2014 0500   GFRAA >90 03/12/2014 0500   Lab Results  Component Value Date   HGBA1C 5.7 (H) 01/22/2022   HGBA1C 6.5 (H) 07/27/2021   Lab Results  Component Value Date   INSULIN 25.8 (H) 01/22/2022   INSULIN 32.6 (H) 07/27/2021   Lab Results  Component Value Date   TSH 0.756 07/27/2021   CBC    Component Value Date/Time   WBC 12.2 (H) 07/27/2021 1036   WBC 14.8 (H) 03/12/2014 0500   RBC 4.81 07/27/2021 1036   RBC 4.04 03/12/2014 0500   HGB 13.2 07/27/2021 1036   HCT 40.7 07/27/2021 1036   PLT 308 07/27/2021 1036   MCV 85 07/27/2021 1036   MCH 27.4 07/27/2021 1036   MCH 28.5 03/12/2014 0500   MCHC 32.4 07/27/2021 1036   MCHC 33.1 03/12/2014 0500   RDW 12.8 07/27/2021 1036   Iron Studies No results found for: "IRON", "TIBC", "FERRITIN", "IRONPCTSAT" Lipid Panel     Component Value Date/Time   CHOL 152 01/22/2022 1213   TRIG 99 01/22/2022 1213   HDL 46 01/22/2022 1213   LDLCALC 88  01/22/2022 1213   Hepatic Function Panel     Component Value Date/Time   PROT 7.1 01/22/2022 1213   ALBUMIN 4.7 01/22/2022 1213   AST 15 01/22/2022 1213   ALT 20 01/22/2022 1213   ALKPHOS 53 01/22/2022 1213   BILITOT 0.5 01/22/2022 1213      Component Value Date/Time   TSH 0.756 07/27/2021 1036   Nutritional Lab Results  Component Value Date   VD25OH 62.5 01/22/2022   VD25OH 17.6 (L) 07/27/2021     ASSESSMENT AND PLAN  TREATMENT PLAN FOR OBESITY:  Recommended Dietary Goals  Sherri [name] is currently in the action stage of change. As such, her goal is to continue weight management plan. She has agreed to keeping a food journal and adhering to recommended goals of 1500 calories and 100 grams  protein.  Behavioral Intervention  We discussed the following Behavioral Modification Strategies today: increasing lean protein intake, increasing vegetables, avoid skipping meals, and increase water intake.  Additional resources provided today: NA  Recommended Physical Activity Goals  Sherri [name] has been advised to work up to 150 minutes of moderate intensity aerobic activity a week and strengthening exercises 2-3 times per week for cardiovascular health, weight loss maintenance and preservation of muscle mass.   She has agreed to increase physical activity in their day and reduce sedentary time (increase NEAT).    ASSOCIATED CONDITIONS ADDRESSED TODAY  RLS (restless legs syndrome) Has taken Requip 3-4 times since her last visit.  Finds it's beneficial when she takes it but notes some daytime sleepiness.  -     rOPINIRole HCl; Take 1 tablet (0.25 mg total) by mouth at bedtime.  Dispense: 30 tablet; Refill: 0  Vitamin D deficiency Taking Vit D 50,000 IU every 2 weeks.  Denies side effects.  Denies nausea, vomiting or muscle weakness.  -     Ergocalciferol; Take 1 capsule (50,000 Units total) by mouth once a week.  Dispense: 12 capsule; Refill: 0  Generalized obesity  BMI 30.0-30.9,adult   Will check labs at next visit.    Having bladder surgery April 18th.    Return in about 8 weeks (around 07/17/2022).Marland Kitchen She was informed of the importance of frequent follow up visits to maximize her success with intensive lifestyle modifications for her multiple health conditions.   ATTESTASTION STATEMENTS:  Reviewed by clinician on day of visit: allergies, medications, problem list, medical history, surgical history, family history, social history, and previous encounter notes.     Ailene Rud. Thoma Paulsen FNP-C

## 2022-06-26 ENCOUNTER — Other Ambulatory Visit: Payer: Self-pay | Admitting: Nurse Practitioner

## 2022-06-26 DIAGNOSIS — G2581 Restless legs syndrome: Secondary | ICD-10-CM

## 2022-08-01 ENCOUNTER — Ambulatory Visit: Payer: Managed Care, Other (non HMO) | Admitting: Nurse Practitioner

## 2022-08-15 ENCOUNTER — Ambulatory Visit: Payer: Managed Care, Other (non HMO) | Admitting: Nurse Practitioner

## 2022-08-15 ENCOUNTER — Encounter: Payer: Self-pay | Admitting: Nurse Practitioner

## 2022-08-15 VITALS — BP 110/72 | HR 66 | Temp 97.7°F | Ht 64.0 in | Wt 184.0 lb

## 2022-08-15 DIAGNOSIS — E65 Localized adiposity: Secondary | ICD-10-CM | POA: Diagnosis not present

## 2022-08-15 DIAGNOSIS — E669 Obesity, unspecified: Secondary | ICD-10-CM | POA: Diagnosis not present

## 2022-08-15 DIAGNOSIS — E1169 Type 2 diabetes mellitus with other specified complication: Secondary | ICD-10-CM | POA: Diagnosis not present

## 2022-08-15 DIAGNOSIS — Z6831 Body mass index (BMI) 31.0-31.9, adult: Secondary | ICD-10-CM | POA: Diagnosis not present

## 2022-08-15 NOTE — Progress Notes (Signed)
Office: [phone]  /  Fax: [phone]  WEIGHT SUMMARY AND BIOMETRICS  No data recorded Weight Gained Since Last Visit: 7lb   Vitals Temp: 97.7 F (36.5 C) BP: 110/72 Pulse Rate: 66 SpO2: 100 %   Anthropometric Measurements Height: 5\' 4"  (1.626 m) Weight: 184 lb (83.5 kg) BMI (Calculated): 31.57 Weight at Last Visit: 177lb Weight Gained Since Last Visit: 7lb Starting Weight: 211lb Total Weight Loss (lbs): 27 lb (12.2 kg)   Body Composition  Body Fat %: 38.8 % Fat Mass (lbs): 71.4 lbs Muscle Mass (lbs): 107 lbs Total Body Water (lbs): 80.6 lbs Visceral Fat Rating : 9   Other Clinical Data Fasting: No Labs: No Today's Visit #: 11 Starting Date: 07/27/21     HPI  Chief Complaint: OBESITY  Sherri [name] is here to discuss her progress with her obesity treatment plan. She is on the keeping a food journal and adhering to recommended goals of 1500 calories and 90+ protein and states she is following her eating plan approximately 0 % of the time. She states she is exercising 25 minutes 5 days per week.   Interval History:  Since last office visit she has gained 7 pounds.  She has been on vacation and finished up tax season since her last visit.  Since tax season has finish, she is working on getting back on track.  She is not eating enough protein.  She is drinking water daily.  She started drinking Sprites this week.  Today is the day to get back on track.     Previous pharmacotherapy for medical weight loss:  Bupropion.   Bariatric surgery:  Patient has not had bariatric surgery.   Pharmacotherapy for DMT2:  She is currently taking any medications at this time. She stopped taking Mounjaro 1 month ago due to needing a PA.  Denied side effects while taking.   Last A1c was 5.7 She is not checking BS at home.   Episodes of hypoglycemia: no On statin.  Last eye exam:  2023 She has tried Metformin in the past.  Stopped due to side effects.  She also took Ozempic  and stopped due to shortage.    Lab Results  Component Value Date   HGBA1C 5.7 (H) 01/22/2022   HGBA1C 6.5 (H) 07/27/2021   Lab Results  Component Value Date   LDLCALC 88 01/22/2022   CREATININE 0.63 01/22/2022    Abdominal pannus Struggles with redness, irritation and pain. Has seen derm in the past.  Uses OTC meds with little relief.    PHYSICAL EXAM:  Blood pressure 110/72, pulse 66, temperature 97.7 F (36.5 C), height 5\' 4"  (1.626 m), weight 184 lb (83.5 kg), SpO2 100 %. Body mass index is 31.58 kg/m.  General: She is overweight, cooperative, alert, well developed, and in no acute distress. PSYCH: Has normal mood, affect and thought process.   Extremities: No edema.  Neurologic: No gross sensory or motor deficits. No tremors or fasciculations noted.    DIAGNOSTIC DATA REVIEWED:  BMET    Component Value Date/Time   NA 139 01/22/2022 1213   K 4.5 01/22/2022 1213   CL 102 01/22/2022 1213   CO2 23 01/22/2022 1213   GLUCOSE 111 (H) 01/22/2022 1213   GLUCOSE 132 (H) 03/12/2014 0500   BUN 10 01/22/2022 1213   CREATININE 0.63 01/22/2022 1213   CREATININE 0.59 07/15/2013 1309   CALCIUM 9.2 01/22/2022 1213   GFRNONAA >90 03/12/2014 0500   GFRAA >90 03/12/2014 0500   Lab  Results  Component Value Date   HGBA1C 5.7 (H) 01/22/2022   HGBA1C 6.5 (H) 07/27/2021   Lab Results  Component Value Date   INSULIN 25.8 (H) 01/22/2022   INSULIN 32.6 (H) 07/27/2021   Lab Results  Component Value Date   TSH 0.756 07/27/2021   CBC    Component Value Date/Time   WBC 12.2 (H) 07/27/2021 1036   WBC 14.8 (H) 03/12/2014 0500   RBC 4.81 07/27/2021 1036   RBC 4.04 03/12/2014 0500   HGB 13.2 07/27/2021 1036   HCT 40.7 07/27/2021 1036   PLT 308 07/27/2021 1036   MCV 85 07/27/2021 1036   MCH 27.4 07/27/2021 1036   MCH 28.5 03/12/2014 0500   MCHC 32.4 07/27/2021 1036   MCHC 33.1 03/12/2014 0500   RDW 12.8 07/27/2021 1036   Iron Studies No results found for: "IRON", "TIBC",  "FERRITIN", "IRONPCTSAT" Lipid Panel     Component Value Date/Time   CHOL 152 01/22/2022 1213   TRIG 99 01/22/2022 1213   HDL 46 01/22/2022 1213   LDLCALC 88 01/22/2022 1213   Hepatic Function Panel     Component Value Date/Time   PROT 7.1 01/22/2022 1213   ALBUMIN 4.7 01/22/2022 1213   AST 15 01/22/2022 1213   ALT 20 01/22/2022 1213   ALKPHOS 53 01/22/2022 1213   BILITOT 0.5 01/22/2022 1213      Component Value Date/Time   TSH 0.756 07/27/2021 1036   Nutritional Lab Results  Component Value Date   VD25OH 62.5 01/22/2022   VD25OH 17.6 (L) 07/27/2021     ASSESSMENT AND PLAN  TREATMENT PLAN FOR OBESITY:  Recommended Dietary Goals  Sherri [name] is currently in the action stage of change. As such, her goal is to continue weight management plan. She has agreed to keeping a food journal and adhering to recommended goals of 1500 calories and 90 protein.  Behavioral Intervention  We discussed the following Behavioral Modification Strategies today: increasing lean protein intake, decreasing simple carbohydrates , increasing vegetables, increasing lower glycemic fruits, increasing fiber rich foods, avoiding skipping meals, increasing water intake, continue to practice mindfulness when eating, and planning for success.  Additional resources provided today: NA  Recommended Physical Activity Goals  Sherri [name] has been advised to work up to 150 minutes of moderate intensity aerobic activity a week and strengthening exercises 2-3 times per week for cardiovascular health, weight loss maintenance and preservation of muscle mass.   She has agreed to Continue current level of physical activity     ASSOCIATED CONDITIONS ADDRESSED TODAY  Action/Plan  Type 2 diabetes mellitus with other specified complication, unspecified whether long term insulin use ([facility]) Continue to follow up with endo.  Take meds as directed.  Good blood sugar control is important to decrease the likelihood of  diabetic complications such as nephropathy, neuropathy, limb loss, blindness, coronary artery disease, and death. Intensive lifestyle modification including diet, exercise and weight loss are the first line of treatment for diabetes.    Abdominal pannus Will continue to monitor.    Generalized obesity  BMI 31.0-31.9,adult         Return in about 4 weeks (around 09/12/2022).Marland Kitchen She was informed of the importance of frequent follow up visits to maximize her success with intensive lifestyle modifications for her multiple health conditions.   ATTESTASTION STATEMENTS:  Reviewed by clinician on day of visit: allergies, medications, problem list, medical history, surgical history, family history, social history, and previous encounter notes.   Time spent on visit including pre-visit  chart review and post-visit care and charting was 30 minutes.    Ailene Rud. Mella Inclan FNP-C

## 2022-09-26 ENCOUNTER — Encounter: Payer: Self-pay | Admitting: Nurse Practitioner

## 2022-09-26 ENCOUNTER — Ambulatory Visit: Payer: Managed Care, Other (non HMO) | Admitting: Nurse Practitioner

## 2022-09-26 VITALS — BP 105/65 | HR 60 | Temp 97.7°F | Ht 64.0 in | Wt 177.0 lb

## 2022-09-26 DIAGNOSIS — Z6830 Body mass index (BMI) 30.0-30.9, adult: Secondary | ICD-10-CM

## 2022-09-26 DIAGNOSIS — F5089 Other specified eating disorder: Secondary | ICD-10-CM | POA: Diagnosis not present

## 2022-09-26 DIAGNOSIS — E559 Vitamin D deficiency, unspecified: Secondary | ICD-10-CM

## 2022-09-26 DIAGNOSIS — E669 Obesity, unspecified: Secondary | ICD-10-CM | POA: Diagnosis not present

## 2022-09-26 MED ORDER — ERGOCALCIFEROL 1.25 MG (50000 UT) PO CAPS
50000.0000 [IU] | ORAL_CAPSULE | ORAL | 0 refills | Status: DC
Start: 2022-09-26 — End: 2023-10-08

## 2022-09-26 MED ORDER — BUPROPION HCL ER (SR) 200 MG PO TB12
200.0000 mg | ORAL_TABLET | Freq: Two times a day (BID) | ORAL | 0 refills | Status: DC
Start: 2022-09-26 — End: 2023-01-01

## 2022-09-26 NOTE — Progress Notes (Signed)
Office: [phone]  /  Fax: [phone]  WEIGHT SUMMARY AND BIOMETRICS  Weight Lost Since Last Visit: 7lb   Vitals Temp: 97.7 F (36.5 C) BP: 105/65 Pulse Rate: 60 SpO2: 98 %   Anthropometric Measurements Height: 5\' 4"  (1.626 m) Weight: 177 lb (80.3 kg) BMI (Calculated): 30.37 Weight at Last Visit: 184lb Weight Lost Since Last Visit: 7lb Starting Weight: 211lb Total Weight Loss (lbs): 34 lb (15.4 kg)   Body Composition  Body Fat %: 37.4 % Fat Mass (lbs): 66.4 lbs Muscle Mass (lbs): 105.8 lbs Total Body Water (lbs): 79.2 lbs Visceral Fat Rating : 8   Other Clinical Data Fasting: no Labs: no Today's Visit #: 12 Starting Date: 07/27/21     HPI  Chief Complaint: OBESITY  Sherri [name] is here to discuss her progress with her obesity treatment plan. She is on the keeping a food journal and adhering to recommended goals of 1500 calories and 90+ protein and states she is following her eating plan approximately 50 % of the time. She states she is exercising 60 minutes 7 days per week-walking the dogs 2 times per day.   Interval History:  Since last office visit she has lost 7 pounds.   She is struggling to meet her protein goals. She is averaging around 1100-1300 calories and 50-70 grams of protein.    Her highest was 231 lbs.  Goal weight:  150 lbs.  She is going on vacation next week. She is drinking water and sugar free juice.     Bariatric surgery:  Has not had bariatric surgery.   Vit D deficiency  She is taking Vit D 50,000 IU every 2 weeks.  Denies side effects.  Denies nausea, vomiting or muscle weakness. Last Vit D was 44.7 on June 10th.  Labs reviewed with patient.     Lab Results  Component Value Date   VD25OH 62.5 01/22/2022   VD25OH 17.6 (L) 07/27/2021    Disorder of eating Taking Wellbutrin 200mg  SR BID.  Denies side effects.  Helps with cravings.    PHYSICAL EXAM:  Blood pressure 105/65, pulse 60, temperature 97.7 F (36.5 C), height 5\' 4"   (1.626 m), weight 177 lb (80.3 kg), SpO2 98 %. Body mass index is 30.38 kg/m.  General: She is overweight, cooperative, alert, well developed, and in no acute distress. PSYCH: Has normal mood, affect and thought process.   Extremities: No edema.  Neurologic: No gross sensory or motor deficits. No tremors or fasciculations noted.    DIAGNOSTIC DATA REVIEWED:  BMET    Component Value Date/Time   NA 139 01/22/2022 1213   K 4.5 01/22/2022 1213   CL 102 01/22/2022 1213   CO2 23 01/22/2022 1213   GLUCOSE 111 (H) 01/22/2022 1213   GLUCOSE 132 (H) 03/12/2014 0500   BUN 10 01/22/2022 1213   CREATININE 0.63 01/22/2022 1213   CREATININE 0.59 07/15/2013 1309   CALCIUM 9.2 01/22/2022 1213   GFRNONAA >90 03/12/2014 0500   GFRAA >90 03/12/2014 0500   Lab Results  Component Value Date   HGBA1C 5.7 (H) 01/22/2022   HGBA1C 6.5 (H) 07/27/2021   Lab Results  Component Value Date   INSULIN 25.8 (H) 01/22/2022   INSULIN 32.6 (H) 07/27/2021   Lab Results  Component Value Date   TSH 0.756 07/27/2021   CBC    Component Value Date/Time   WBC 12.2 (H) 07/27/2021 1036   WBC 14.8 (H) 03/12/2014 0500   RBC 4.81 07/27/2021 1036   RBC  4.04 03/12/2014 0500   HGB 13.2 07/27/2021 1036   HCT 40.7 07/27/2021 1036   PLT 308 07/27/2021 1036   MCV 85 07/27/2021 1036   MCH 27.4 07/27/2021 1036   MCH 28.5 03/12/2014 0500   MCHC 32.4 07/27/2021 1036   MCHC 33.1 03/12/2014 0500   RDW 12.8 07/27/2021 1036   Iron Studies No results found for: "IRON", "TIBC", "FERRITIN", "IRONPCTSAT" Lipid Panel     Component Value Date/Time   CHOL 152 01/22/2022 1213   TRIG 99 01/22/2022 1213   HDL 46 01/22/2022 1213   LDLCALC 88 01/22/2022 1213   Hepatic Function Panel     Component Value Date/Time   PROT 7.1 01/22/2022 1213   ALBUMIN 4.7 01/22/2022 1213   AST 15 01/22/2022 1213   ALT 20 01/22/2022 1213   ALKPHOS 53 01/22/2022 1213   BILITOT 0.5 01/22/2022 1213      Component Value Date/Time   TSH  0.756 07/27/2021 1036   Nutritional Lab Results  Component Value Date   VD25OH 62.5 01/22/2022   VD25OH 17.6 (L) 07/27/2021     ASSESSMENT AND PLAN  TREATMENT PLAN FOR OBESITY:  Recommended Dietary Goals  Sherri [name] is currently in the action stage of change. As such, her goal is to continue weight management plan. She has agreed to keeping a food journal and adhering to recommended goals of 1500 calories and 90 protein.  Behavioral Intervention  We discussed the following Behavioral Modification Strategies today: increasing lean protein intake, decreasing simple carbohydrates , increasing vegetables, increasing lower glycemic fruits, increasing water intake, work on meal planning and preparation, keeping healthy foods at home, continue to practice mindfulness when eating, and planning for success.  Additional resources provided today: NA  Recommended Physical Activity Goals  Sherri [name] has been advised to work up to 150 minutes of moderate intensity aerobic activity a week and strengthening exercises 2-3 times per week for cardiovascular health, weight loss maintenance and preservation of muscle mass.   She has agreed to Continue current level of physical activity    ASSOCIATED CONDITIONS ADDRESSED TODAY  Action/Plan  Vitamin D deficiency -     Ergocalciferol; Take 1 capsule (50,000 Units total) by mouth once a week.  Dispense: 12 capsule; Refill: 0  Other disorder of eating, with emotinal eating -     buPROPion HCl ER (SR); Take 1 tablet (200 mg total) by mouth 2 (two) times daily.  Dispense: 180 tablet; Refill: 0  Generalized obesity  BMI 30.0-30.9,adult         Return in about 6 weeks (around 11/07/2022).Marland Kitchen She was informed of the importance of frequent follow up visits to maximize her success with intensive lifestyle modifications for her multiple health conditions.   ATTESTASTION STATEMENTS:  Reviewed by clinician on day of visit: allergies, medications, problem  list, medical history, surgical history, family history, social history, and previous encounter notes.     Theodis Sato. Sherri Bosques FNP-C

## 2022-11-04 ENCOUNTER — Ambulatory Visit (INDEPENDENT_AMBULATORY_CARE_PROVIDER_SITE_OTHER): Payer: Managed Care, Other (non HMO) | Admitting: Nurse Practitioner

## 2022-11-04 ENCOUNTER — Encounter: Payer: Self-pay | Admitting: Nurse Practitioner

## 2022-11-04 VITALS — BP 100/68 | HR 66 | Temp 98.0°F | Ht 64.0 in | Wt 177.0 lb

## 2022-11-04 DIAGNOSIS — E669 Obesity, unspecified: Secondary | ICD-10-CM

## 2022-11-04 DIAGNOSIS — Z6830 Body mass index (BMI) 30.0-30.9, adult: Secondary | ICD-10-CM | POA: Diagnosis not present

## 2022-11-04 DIAGNOSIS — E1169 Type 2 diabetes mellitus with other specified complication: Secondary | ICD-10-CM

## 2022-11-04 NOTE — Progress Notes (Signed)
Office: [phone]  /  Fax: [phone]  WEIGHT SUMMARY AND BIOMETRICS  Weight Lost Since Last Visit: 0lb  Weight Gained Since Last Visit: 0lb   Vitals Temp: 98 F (36.7 C) BP: 100/68 Pulse Rate: 66 SpO2: 96 %   Anthropometric Measurements Height: 5\' 4"  (1.626 m) Weight: 177 lb (80.3 kg) BMI (Calculated): 30.37 Weight at Last Visit: 177lb Weight Lost Since Last Visit: 0lb Weight Gained Since Last Visit: 0lb Starting Weight: 211lb Total Weight Loss (lbs): 34 lb (15.4 kg)   Body Composition  Body Fat %: 35.6 % Fat Mass (lbs): 63.2 lbs Muscle Mass (lbs): 108.4 lbs Total Body Water (lbs): 75.8 lbs Visceral Fat Rating : 8   Other Clinical Data Fasting: No Labs: No Today's Visit #: 13 Starting Date: 07/27/21     HPI  Chief Complaint: OBESITY  Sherri [name] is here to discuss her progress with her obesity treatment plan. She is on the keeping a food journal and adhering to recommended goals of 1500 calories and 90 protein and states she is following her eating plan approximately 80 % of the time. She states she is exercising 30-45 minutes 7 days per week.   Interval History:  Since last office visit she has maintained her weight.  She went on vacation since her last visit.  She notes she has gotten off track and plans to restart tracking today. She does well with meeting protein goals for breakfast and lunch. Struggles with dinner since she has to cook for everyone in the family.  She is overall pleased that she didn't gain weight.     Pharmacotherapy for DMT2:  She is currently taking Mounjaro 10mg  (recently increased by Endo).  Repots some side effects of nausea.  Saw Endo last on 10/15/22.   Last A1c was 5.5 CBGs: Fasting 155   Episodes of hypoglycemia: no On ACE or ARB, ASA 81mg  and statin.  Last eye exam:  eye exam scheduled Oct or Nov She has tried Metformin and Ozempic in the past.  She stopped Metformin due to side effects.    Lab Results  Component  Value Date   HGBA1C 5.7 (H) 01/22/2022   HGBA1C 6.5 (H) 07/27/2021   Lab Results  Component Value Date   LDLCALC 88 01/22/2022   CREATININE 0.63 01/22/2022     PHYSICAL EXAM:  Blood pressure 100/68, pulse 66, temperature 98 F (36.7 C), height 5\' 4"  (1.626 m), weight 177 lb (80.3 kg), SpO2 96%. Body mass index is 30.38 kg/m.  General: She is overweight, cooperative, alert, well developed, and in no acute distress. PSYCH: Has normal mood, affect and thought process.   Extremities: No edema.  Neurologic: No gross sensory or motor deficits. No tremors or fasciculations noted.    DIAGNOSTIC DATA REVIEWED:  BMET    Component Value Date/Time   NA 139 01/22/2022 1213   K 4.5 01/22/2022 1213   CL 102 01/22/2022 1213   CO2 23 01/22/2022 1213   GLUCOSE 111 (H) 01/22/2022 1213   GLUCOSE 132 (H) 03/12/2014 0500   BUN 10 01/22/2022 1213   CREATININE 0.63 01/22/2022 1213   CREATININE 0.59 07/15/2013 1309   CALCIUM 9.2 01/22/2022 1213   GFRNONAA >90 03/12/2014 0500   GFRAA >90 03/12/2014 0500   Lab Results  Component Value Date   HGBA1C 5.7 (H) 01/22/2022   HGBA1C 6.5 (H) 07/27/2021   Lab Results  Component Value Date   INSULIN 25.8 (H) 01/22/2022   INSULIN 32.6 (H) 07/27/2021   Lab  Results  Component Value Date   TSH 0.756 07/27/2021   CBC    Component Value Date/Time   WBC 12.2 (H) 07/27/2021 1036   WBC 14.8 (H) 03/12/2014 0500   RBC 4.81 07/27/2021 1036   RBC 4.04 03/12/2014 0500   HGB 13.2 07/27/2021 1036   HCT 40.7 07/27/2021 1036   PLT 308 07/27/2021 1036   MCV 85 07/27/2021 1036   MCH 27.4 07/27/2021 1036   MCH 28.5 03/12/2014 0500   MCHC 32.4 07/27/2021 1036   MCHC 33.1 03/12/2014 0500   RDW 12.8 07/27/2021 1036   Iron Studies No results found for: "IRON", "TIBC", "FERRITIN", "IRONPCTSAT" Lipid Panel     Component Value Date/Time   CHOL 152 01/22/2022 1213   TRIG 99 01/22/2022 1213   HDL 46 01/22/2022 1213   LDLCALC 88 01/22/2022 1213    Hepatic Function Panel     Component Value Date/Time   PROT 7.1 01/22/2022 1213   ALBUMIN 4.7 01/22/2022 1213   AST 15 01/22/2022 1213   ALT 20 01/22/2022 1213   ALKPHOS 53 01/22/2022 1213   BILITOT 0.5 01/22/2022 1213      Component Value Date/Time   TSH 0.756 07/27/2021 1036   Nutritional Lab Results  Component Value Date   VD25OH 62.5 01/22/2022   VD25OH 17.6 (L) 07/27/2021     ASSESSMENT AND PLAN  TREATMENT PLAN FOR OBESITY:  Recommended Dietary Goals  Sherri [name] is currently in the action stage of change. As such, her goal is to continue weight management plan. She has agreed to keeping a food journal and adhering to recommended goals of 1500 calories and 90+ protein.  Behavioral Intervention  We discussed the following Behavioral Modification Strategies today: increasing lean protein intake, decreasing simple carbohydrates , increasing vegetables, increasing lower glycemic fruits, avoiding skipping meals, increasing water intake, work on tracking and journaling calories using tracking application, keeping healthy foods at home, continue to practice mindfulness when eating, and planning for success.  Additional resources provided today: NA  Recommended Physical Activity Goals  Sherri [name] has been advised to work up to 150 minutes of moderate intensity aerobic activity a week and strengthening exercises 2-3 times per week for cardiovascular health, weight loss maintenance and preservation of muscle mass.   She has agreed to Continue current level of physical activity  and Think about ways to increase daily physical activity and overcoming barriers to exercise   ASSOCIATED CONDITIONS ADDRESSED TODAY  Action/Plan  Type 2 diabetes mellitus with other specified complication, unspecified whether long term insulin use (HCC) Continue to follow up with Endo.  Continue meds as directed  Good blood sugar control is important to decrease the likelihood of diabetic  complications such as nephropathy, neuropathy, limb loss, blindness, coronary artery disease, and death. Intensive lifestyle modification including diet, exercise and weight loss are the first line of treatment for diabetes.    Generalized obesity  BMI 30.0-30.9,adult         Return in about 6 weeks (around 12/16/2022).Marland Kitchen She was informed of the importance of frequent follow up visits to maximize her success with intensive lifestyle modifications for her multiple health conditions.   ATTESTASTION STATEMENTS:  Reviewed by clinician on day of visit: allergies, medications, problem list, medical history, surgical history, family history, social history, and previous encounter notes.   Time spent on visit including pre-visit chart review and post-visit care and charting was 30 minutes.    Theodis Sato. Claudene Gatliff FNP-C

## 2023-01-01 ENCOUNTER — Encounter: Payer: Self-pay | Admitting: Nurse Practitioner

## 2023-01-01 ENCOUNTER — Ambulatory Visit: Payer: Managed Care, Other (non HMO) | Admitting: Nurse Practitioner

## 2023-01-01 VITALS — BP 119/80 | HR 61 | Temp 98.2°F | Ht 64.0 in | Wt 170.0 lb

## 2023-01-01 DIAGNOSIS — E1169 Type 2 diabetes mellitus with other specified complication: Secondary | ICD-10-CM

## 2023-01-01 DIAGNOSIS — Z7985 Long-term (current) use of injectable non-insulin antidiabetic drugs: Secondary | ICD-10-CM

## 2023-01-01 DIAGNOSIS — E669 Obesity, unspecified: Secondary | ICD-10-CM | POA: Diagnosis not present

## 2023-01-01 DIAGNOSIS — F5089 Other specified eating disorder: Secondary | ICD-10-CM | POA: Diagnosis not present

## 2023-01-01 DIAGNOSIS — Z6829 Body mass index (BMI) 29.0-29.9, adult: Secondary | ICD-10-CM | POA: Diagnosis not present

## 2023-01-01 DIAGNOSIS — Z7984 Long term (current) use of oral hypoglycemic drugs: Secondary | ICD-10-CM

## 2023-01-01 MED ORDER — BUPROPION HCL ER (SR) 200 MG PO TB12: 200.0000 mg | ORAL_TABLET | Freq: Two times a day (BID) | ORAL | 0 refills | Status: DC

## 2023-01-01 NOTE — Progress Notes (Signed)
Office: [phone]  /  Fax: [phone]  WEIGHT SUMMARY AND BIOMETRICS  Weight Lost Since Last Visit: 7lb  Weight Gained Since Last Visit: 0lb   Vitals Temp: 98.2 F (36.8 C) BP: 119/80 Pulse Rate: 61 SpO2: 97 %   Anthropometric Measurements Height: 5\' 4"  (1.626 m) Weight: 170 lb (77.1 kg) BMI (Calculated): 29.17 Weight at Last Visit: 177lb Weight Lost Since Last Visit: 7lb Weight Gained Since Last Visit: 0lb Starting Weight: 211lb Total Weight Loss (lbs): 41 lb (18.6 kg)   Body Composition  Body Fat %: 34.4 % Fat Mass (lbs): 58.6 lbs Muscle Mass (lbs): 106.4 lbs Total Body Water (lbs): 73.4 lbs Visceral Fat Rating : 7   Other Clinical Data Fasting: No Labs: No Today's Visit #: 14 Starting Date: 07/27/21     HPI  Chief Complaint: OBESITY  Sherri [name] is here to discuss her progress with her obesity treatment plan. She is on the keeping a food journal and adhering to recommended goals of 1500 calories and 90 protein and states she is following her eating plan approximately 50 % of the time. She states she is exercising 0 minutes 0 days per week.   Interval History:  Since last office visit she has lost 7 pounds.  She has had several celebrations and has traveled multiple times since her last visit.  She has been under a lot of stress-one of her dogs passed away, one got attacked by coyotes and her brother OD.  She is drinking a protein smoothie, coffee and water daily. Her clothes are fitting better. She is overall pleased with how well she has been doing concerning weight loss.   Her highest weight was in the 230s.    Pharmacotherapy for weight loss: She is not currently taking medications  for medical weight loss.    Pharmacotherapy for DMT2:  She is currently taking Mounjaro 10mg  (saw Endo last on 10/15/22).  Repots some side effects of nausea.  Saw Endo last on 10/15/22.   Last A1c was 5.5 on 09/23/22 CBGs: Fasting 130s-150s   Episodes of hypoglycemia:  no On ACE or ARB, ASA 81mg  and statin.  Last eye exam:  eye exam scheduled in Oct or Nov She has tried Metformin and Ozempic in the past.  She stopped Metformin due to side effects.    Disorder of eating Taking Wellbutrin 200mg  SR BID.  Denies side effects.  Helps with cravings.  Needs a refill.   PHYSICAL EXAM:  Blood pressure 119/80, pulse 61, temperature 98.2 F (36.8 C), height 5\' 4"  (1.626 m), weight 170 lb (77.1 kg), SpO2 97%. Body mass index is 29.18 kg/m.  General: She is overweight, cooperative, alert, well developed, and in no acute distress. PSYCH: Has normal mood, affect and thought process.   Extremities: No edema.  Neurologic: No gross sensory or motor deficits. No tremors or fasciculations noted.    DIAGNOSTIC DATA REVIEWED:  BMET    Component Value Date/Time   NA 139 01/22/2022 1213   K 4.5 01/22/2022 1213   CL 102 01/22/2022 1213   CO2 23 01/22/2022 1213   GLUCOSE 111 (H) 01/22/2022 1213   GLUCOSE 132 (H) 03/12/2014 0500   BUN 10 01/22/2022 1213   CREATININE 0.63 01/22/2022 1213   CREATININE 0.59 07/15/2013 1309   CALCIUM 9.2 01/22/2022 1213   GFRNONAA >90 03/12/2014 0500   GFRAA >90 03/12/2014 0500   Lab Results  Component Value Date   HGBA1C 5.7 (H) 01/22/2022   HGBA1C 6.5 (H) 07/27/2021  Lab Results  Component Value Date   INSULIN 25.8 (H) 01/22/2022   INSULIN 32.6 (H) 07/27/2021   Lab Results  Component Value Date   TSH 0.756 07/27/2021   CBC    Component Value Date/Time   WBC 12.2 (H) 07/27/2021 1036   WBC 14.8 (H) 03/12/2014 0500   RBC 4.81 07/27/2021 1036   RBC 4.04 03/12/2014 0500   HGB 13.2 07/27/2021 1036   HCT 40.7 07/27/2021 1036   PLT 308 07/27/2021 1036   MCV 85 07/27/2021 1036   MCH 27.4 07/27/2021 1036   MCH 28.5 03/12/2014 0500   MCHC 32.4 07/27/2021 1036   MCHC 33.1 03/12/2014 0500   RDW 12.8 07/27/2021 1036   Iron Studies No results found for: "IRON", "TIBC", "FERRITIN", "IRONPCTSAT" Lipid Panel      Component Value Date/Time   CHOL 152 01/22/2022 1213   TRIG 99 01/22/2022 1213   HDL 46 01/22/2022 1213   LDLCALC 88 01/22/2022 1213   Hepatic Function Panel     Component Value Date/Time   PROT 7.1 01/22/2022 1213   ALBUMIN 4.7 01/22/2022 1213   AST 15 01/22/2022 1213   ALT 20 01/22/2022 1213   ALKPHOS 53 01/22/2022 1213   BILITOT 0.5 01/22/2022 1213      Component Value Date/Time   TSH 0.756 07/27/2021 1036   Nutritional Lab Results  Component Value Date   VD25OH 62.5 01/22/2022   VD25OH 17.6 (L) 07/27/2021     ASSESSMENT AND PLAN  TREATMENT PLAN FOR OBESITY:  Recommended Dietary Goals  Sherri [name] is currently in the action stage of change. As such, her goal is to continue weight management plan. She has agreed to keeping a food journal and adhering to recommended goals of 1500 calories and 80+ protein.  Behavioral Intervention  We discussed the following Behavioral Modification Strategies today: increasing lean protein intake, decreasing simple carbohydrates , increasing vegetables, increasing lower glycemic fruits, increasing water intake, work on meal planning and preparation, work on tracking and journaling calories using tracking application, reading food labels , keeping healthy foods at home, continue to practice mindfulness when eating, and planning for success.  Additional resources provided today: NA  Recommended Physical Activity Goals  Sherri [name] has been advised to work up to 150 minutes of moderate intensity aerobic activity a week and strengthening exercises 2-3 times per week for cardiovascular health, weight loss maintenance and preservation of muscle mass.   She has agreed to Think about ways to increase daily physical activity and overcoming barriers to exercise, Increase physical activity in their day and reduce sedentary time (increase NEAT)., and Work on scheduling and tracking physical activity.     ASSOCIATED CONDITIONS ADDRESSED  TODAY  Action/Plan  Type 2 diabetes mellitus with other specified complication, unspecified whether long term insulin use (HCC) Continue follow-up with endocrinology.  Continue medications as directed.  Continue to monitor up to her blood sugars at home and keep appointment for eye exam.  Other disorder of eating, with emotinal eating -     Continue buPROPion HCl ER (SR); Take 1 tablet (200 mg total) by mouth 2 (two) times daily.  Dispense: 180 tablet; Refill: 0.  Side effects discussed.   Generalized obesity  BMI 29.0-29.9,adult      Return in about 2 months (around 03/03/2023).Marland Kitchen She was informed of the importance of frequent follow up visits to maximize her success with intensive lifestyle modifications for her multiple health conditions.   ATTESTASTION STATEMENTS:  Reviewed by clinician on day of visit:  allergies, medications, problem list, medical history, surgical history, family history, social history, and previous encounter notes.     Theodis Sato. Charmon Thorson FNP-C

## 2023-03-07 ENCOUNTER — Encounter: Payer: Self-pay | Admitting: Nurse Practitioner

## 2023-03-10 ENCOUNTER — Ambulatory Visit: Payer: Managed Care, Other (non HMO) | Admitting: Nurse Practitioner

## 2023-03-10 NOTE — Telephone Encounter (Signed)
Please reschedule appt, Thanks

## 2023-04-03 ENCOUNTER — Other Ambulatory Visit: Payer: Self-pay | Admitting: Nurse Practitioner

## 2023-04-03 DIAGNOSIS — F5089 Other specified eating disorder: Secondary | ICD-10-CM

## 2023-04-10 ENCOUNTER — Encounter: Payer: Self-pay | Admitting: Nurse Practitioner

## 2023-04-10 ENCOUNTER — Ambulatory Visit: Payer: Managed Care, Other (non HMO) | Admitting: Nurse Practitioner

## 2023-04-10 VITALS — BP 112/74 | HR 59 | Temp 98.1°F | Ht 64.0 in | Wt 160.0 lb

## 2023-04-10 DIAGNOSIS — E669 Obesity, unspecified: Secondary | ICD-10-CM

## 2023-04-10 DIAGNOSIS — Z6827 Body mass index (BMI) 27.0-27.9, adult: Secondary | ICD-10-CM

## 2023-04-10 DIAGNOSIS — F5089 Other specified eating disorder: Secondary | ICD-10-CM | POA: Diagnosis not present

## 2023-04-10 DIAGNOSIS — E1169 Type 2 diabetes mellitus with other specified complication: Secondary | ICD-10-CM | POA: Diagnosis not present

## 2023-04-10 DIAGNOSIS — Z7985 Long-term (current) use of injectable non-insulin antidiabetic drugs: Secondary | ICD-10-CM

## 2023-04-10 MED ORDER — BUPROPION HCL ER (SR) 200 MG PO TB12: 200.0000 mg | ORAL_TABLET | Freq: Two times a day (BID) | ORAL | 0 refills | Status: DC

## 2023-04-10 NOTE — Progress Notes (Signed)
Office: [phone]  /  Fax: [phone]  WEIGHT SUMMARY AND BIOMETRICS  Weight Lost Since Last Visit: 10lb  Weight Gained Since Last Visit: 0lb   Vitals Temp: 98.1 F (36.7 C) BP: 112/74 Pulse Rate: (!) 59 SpO2: 98 %   Anthropometric Measurements Height: 5\' 4"  (1.626 m) Weight: 160 lb (72.6 kg) BMI (Calculated): 27.45 Weight at Last Visit: 170lb Weight Lost Since Last Visit: 10lb Weight Gained Since Last Visit: 0lb Starting Weight: 211lb Total Weight Loss (lbs): 51 lb (23.1 kg)   Body Composition  Body Fat %: 32.9 % Fat Mass (lbs): 52.8 lbs Muscle Mass (lbs): 102.2 lbs Total Body Water (lbs): 72.6 lbs Visceral Fat Rating : 7   Other Clinical Data Fasting: No Labs: No Today's Visit #: 15 Starting Date: 07/27/21     HPI  Chief Complaint: OBESITY  Sherri [name] is here to discuss her progress with her obesity treatment plan. She is on the keeping a food journal and adhering to recommended goals of 1500 calories and 90 protein and states she is following her eating plan approximately 60 % of the time. She states she is exercising 0 minutes 0 days per week.   Interval History:  Since last office visit on 01/01/23 she has lost 10 pounds.  She is having paniculectomy and lipo suction on April 28th.    She is not skipping meals and is aiming to eat a protein with each meal.  She is drinking a protein smoothie, coffee and water daily.   Her highest weight was in the 230s.    Pharmacotherapy for weight loss: She is not currently taking medications  for medical weight loss.    Pharmacotherapy for DMT2:  She  is currently taking Mounjaro 10mg  (saw Endo last on 10/15/22).  Repots some side effects of nausea.  Has follow up appt 05/02/23 Last A1c was 5.5 on 09/23/22 CBGs: not currently checking Episodes of hypoglycemia: no On ACE or ARB, ASA 81mg  and statin.  Last eye exam: Dec 2024 She has tried Metformin and Ozempic in the past.   She stopped Metformin due to side  effects.    Lab Results  Component Value Date   HGBA1C 5.7 (H) 01/22/2022   HGBA1C 6.5 (H) 07/27/2021   Lab Results  Component Value Date   LDLCALC 88 01/22/2022   CREATININE 0.63 01/22/2022   Disorder of eating Taking Wellbutrin 200mg  SR BID.  Denies side effects.  Helps with cravings.  Needs a refill.     PHYSICAL EXAM:  Blood pressure 112/74, pulse (!) 59, temperature 98.1 F (36.7 C), height 5\' 4"  (1.626 m), weight 160 lb (72.6 kg), SpO2 98%. Body mass index is 27.46 kg/m.  General: She is overweight, cooperative, alert, well developed, and in no acute distress. PSYCH: Has normal mood, affect and thought process.   Extremities: No edema.  Neurologic: No gross sensory or motor deficits. No tremors or fasciculations noted.    DIAGNOSTIC DATA REVIEWED:  BMET    Component Value Date/Time   NA 139 01/22/2022 1213   K 4.5 01/22/2022 1213   CL 102 01/22/2022 1213   CO2 23 01/22/2022 1213   GLUCOSE 111 (H) 01/22/2022 1213   GLUCOSE 132 (H) 03/12/2014 0500   BUN 10 01/22/2022 1213   CREATININE 0.63 01/22/2022 1213   CREATININE 0.59 07/15/2013 1309   CALCIUM 9.2 01/22/2022 1213   GFRNONAA >90 03/12/2014 0500   GFRAA >90 03/12/2014 0500   Lab Results  Component Value Date   HGBA1C  5.7 (H) 01/22/2022   HGBA1C 6.5 (H) 07/27/2021   Lab Results  Component Value Date   INSULIN 25.8 (H) 01/22/2022   INSULIN 32.6 (H) 07/27/2021   Lab Results  Component Value Date   TSH 0.756 07/27/2021   CBC    Component Value Date/Time   WBC 12.2 (H) 07/27/2021 1036   WBC 14.8 (H) 03/12/2014 0500   RBC 4.81 07/27/2021 1036   RBC 4.04 03/12/2014 0500   HGB 13.2 07/27/2021 1036   HCT 40.7 07/27/2021 1036   PLT 308 07/27/2021 1036   MCV 85 07/27/2021 1036   MCH 27.4 07/27/2021 1036   MCH 28.5 03/12/2014 0500   MCHC 32.4 07/27/2021 1036   MCHC 33.1 03/12/2014 0500   RDW 12.8 07/27/2021 1036   Iron Studies No results found for: "IRON", "TIBC", "FERRITIN",  "IRONPCTSAT" Lipid Panel     Component Value Date/Time   CHOL 152 01/22/2022 1213   TRIG 99 01/22/2022 1213   HDL 46 01/22/2022 1213   LDLCALC 88 01/22/2022 1213   Hepatic Function Panel     Component Value Date/Time   PROT 7.1 01/22/2022 1213   ALBUMIN 4.7 01/22/2022 1213   AST 15 01/22/2022 1213   ALT 20 01/22/2022 1213   ALKPHOS 53 01/22/2022 1213   BILITOT 0.5 01/22/2022 1213      Component Value Date/Time   TSH 0.756 07/27/2021 1036   Nutritional Lab Results  Component Value Date   VD25OH 62.5 01/22/2022   VD25OH 17.6 (L) 07/27/2021     ASSESSMENT AND PLAN  TREATMENT PLAN FOR OBESITY:  Recommended Dietary Goals  Sherri [name] is currently in the action stage of change. As such, her goal is to continue weight management plan. She has agreed to keeping a food journal and adhering to recommended goals of 1500 calories and 100+ protein.  Behavioral Intervention  We discussed the following Behavioral Modification Strategies today: increasing lean protein intake to established goals, increasing water intake , celebration eating strategies, and continue to work on maintaining a reduced calorie state, getting the recommended amount of protein, incorporating whole foods, making healthy choices, staying well hydrated and practicing mindfulness when eating..  Additional resources provided today: NA  Recommended Physical Activity Goals  Sherri [name] has been advised to work up to 150 minutes of moderate intensity aerobic activity a week and strengthening exercises 2-3 times per week for cardiovascular health, weight loss maintenance and preservation of muscle mass.   She has agreed to Think about enjoyable ways to increase daily physical activity and overcoming barriers to exercise, Increase physical activity in their day and reduce sedentary time (increase NEAT)., and Work on scheduling and tracking physical activity.    ASSOCIATED CONDITIONS ADDRESSED TODAY  Action/Plan  Type  2 diabetes mellitus with other specified complication, unspecified whether long term insulin use (HCC) Keep follow up appt with endo. Continue meds as directed  Other disorder of eating, with emotinal eating -     buPROPion HCl ER (SR); Take 1 tablet (200 mg total) by mouth 2 (two) times daily.  Dispense: 180 tablet; Refill: 0.  Side effects discussed  Generalized obesity  BMI 27.0-27.9,adult         Return in about 2 months (around 06/11/2023).Marland Kitchen She was informed of the importance of frequent follow up visits to maximize her success with intensive lifestyle modifications for her multiple health conditions.   ATTESTASTION STATEMENTS:  Reviewed by clinician on day of visit: allergies, medications, problem list, medical history, surgical history, family history, social  history, and previous encounter notes.    Theodis Sato. Sherri Gradel FNP-C

## 2023-07-11 ENCOUNTER — Other Ambulatory Visit: Payer: Self-pay | Admitting: Nurse Practitioner

## 2023-07-11 DIAGNOSIS — F5089 Other specified eating disorder: Secondary | ICD-10-CM

## 2023-09-16 ENCOUNTER — Ambulatory Visit: Payer: Managed Care, Other (non HMO) | Admitting: Nurse Practitioner

## 2023-10-08 ENCOUNTER — Ambulatory Visit (INDEPENDENT_AMBULATORY_CARE_PROVIDER_SITE_OTHER): Admitting: Nurse Practitioner

## 2023-10-08 ENCOUNTER — Encounter: Payer: Self-pay | Admitting: Nurse Practitioner

## 2023-10-08 VITALS — BP 125/80 | HR 64 | Temp 97.7°F | Ht 64.0 in | Wt 147.0 lb

## 2023-10-08 DIAGNOSIS — E559 Vitamin D deficiency, unspecified: Secondary | ICD-10-CM

## 2023-10-08 DIAGNOSIS — R300 Dysuria: Secondary | ICD-10-CM | POA: Diagnosis not present

## 2023-10-08 DIAGNOSIS — Z6825 Body mass index (BMI) 25.0-25.9, adult: Secondary | ICD-10-CM

## 2023-10-08 DIAGNOSIS — E1169 Type 2 diabetes mellitus with other specified complication: Secondary | ICD-10-CM | POA: Diagnosis not present

## 2023-10-08 DIAGNOSIS — E669 Obesity, unspecified: Secondary | ICD-10-CM

## 2023-10-08 DIAGNOSIS — R5383 Other fatigue: Secondary | ICD-10-CM | POA: Diagnosis not present

## 2023-10-08 DIAGNOSIS — F5089 Other specified eating disorder: Secondary | ICD-10-CM

## 2023-10-08 DIAGNOSIS — Z7985 Long-term (current) use of injectable non-insulin antidiabetic drugs: Secondary | ICD-10-CM

## 2023-10-08 DIAGNOSIS — Z79899 Other long term (current) drug therapy: Secondary | ICD-10-CM

## 2023-10-08 MED ORDER — ERGOCALCIFEROL 1.25 MG (50000 UT) PO CAPS: 50000.0000 [IU] | ORAL_CAPSULE | ORAL | 0 refills | Status: AC

## 2023-10-08 MED ORDER — BUPROPION HCL ER (SR) 200 MG PO TB12
200.0000 mg | ORAL_TABLET | Freq: Two times a day (BID) | ORAL | 0 refills | Status: AC
Start: 2023-10-08 — End: ?

## 2023-10-08 NOTE — Progress Notes (Signed)
 Office: [phone]  /  Fax: [phone]  WEIGHT SUMMARY AND BIOMETRICS  Weight Lost Since Last Visit: 13lb  Weight Gained Since Last Visit: 0lb   Vitals Temp: 97.7 F (36.5 C) BP: 125/80 Pulse Rate: 64 SpO2: 100 %   Anthropometric Measurements Height: 5' 4 (1.626 m) Weight: 147 lb (66.7 kg) BMI (Calculated): 25.22 Weight at Last Visit: 160lb Weight Lost Since Last Visit: 13lb Weight Gained Since Last Visit: 0lb Starting Weight: 211lb Total Weight Loss (lbs): 64 lb (29 kg)   Body Composition  Body Fat %: 31.8 % Fat Mass (lbs): 47 lbs Muscle Mass (lbs): 95.6 lbs Total Body Water  (lbs): 71.8 lbs Visceral Fat Rating : 6   Other Clinical Data Fasting: No Labs: No Today's Visit #: 16 Starting Date: 07/27/21     HPI  Chief Complaint: OBESITY  Sherri [name] is here to discuss her progress with her obesity treatment plan. She is on the keeping a food journal and adhering to recommended goals of 1500 calories and 90 protein and states she is following her eating plan approximately 90 % of the time. She states she is exercising 60 minutes 5 days per week.   Interval History:  Since last office visit on 04/10/23 she has lost 13 pounds.  She had an abdominoplasty, breast lift and lipo April 28th.  She has recovered well and has been released to exercise without any restrictions.     Her highest weight was in the 230s.   She has overall done well with weight loss.   Pharmacotherapy for weight loss: She is currently taking Wellbutrin  SR 200mg  off-label for food impulse control and cravings.  Denies side effects and is doing well.SABRA      Pharmacotherapy for DMT2:   She  is currently taking Mounjaro 12.5mg  (saw Endo last on 09/30/23).  Denies side effects.   Last A1c was 5.5 on 09/30/23 CBGs: not currently checking Episodes of hypoglycemia: denies  She is on a statin-crestor 20mg  Last eye exam: Dec 2024 She has tried Metformin and Ozempic  in the past.   She stopped  Metformin due to side effects.   Lab Results  Component Value Date   HGBA1C 5.7 (H) 01/22/2022   HGBA1C 6.5 (H) 07/27/2021   Lab Results  Component Value Date   LDLCALC 88 01/22/2022   CREATININE 0.63 01/22/2022      Dysuria Has a history of multipe UTIs over the years.  She has seen urology in the past and does well with bactrim .  Denies hematuria.  Denies fever, chills, nausea, vomiting or flank pain.  Has taken AZO in the past.    Vit D deficiency  She is taking Vit D 50,000 IU weekly.  Denies side effects.  Denies nausea, vomiting or muscle weakness.    Lab Results  Component Value Date   VD25OH 62.5 01/22/2022   VD25OH 17.6 (L) 07/27/2021    Vit B12 def Reports a history of vitamin B12 deficiency.  Reports fatigue  PHYSICAL EXAM:  Blood pressure 125/80, pulse 64, temperature 97.7 F (36.5 C), height 5' 4 (1.626 m), weight 147 lb (66.7 kg), SpO2 100%. Body mass index is 25.23 kg/m.  General: She is overweight, cooperative, alert, well developed, and in no acute distress. PSYCH: Has normal mood, affect and thought process.   Extremities: No edema.  Neurologic: No gross sensory or motor deficits. No tremors or fasciculations noted.    DIAGNOSTIC DATA REVIEWED:  BMET    Component Value Date/Time   NA  139 01/22/2022 1213   K 4.5 01/22/2022 1213   CL 102 01/22/2022 1213   CO2 23 01/22/2022 1213   GLUCOSE 111 (H) 01/22/2022 1213   GLUCOSE 132 (H) 03/12/2014 0500   BUN 10 01/22/2022 1213   CREATININE 0.63 01/22/2022 1213   CREATININE 0.59 07/15/2013 1309   CALCIUM  9.2 01/22/2022 1213   GFRNONAA >90 03/12/2014 0500   GFRAA >90 03/12/2014 0500   Lab Results  Component Value Date   HGBA1C 5.7 (H) 01/22/2022   HGBA1C 6.5 (H) 07/27/2021   Lab Results  Component Value Date   INSULIN  25.8 (H) 01/22/2022   INSULIN  32.6 (H) 07/27/2021   Lab Results  Component Value Date   TSH 0.756 07/27/2021   CBC    Component Value Date/Time   WBC 12.2 (H) 07/27/2021  1036   WBC 14.8 (H) 03/12/2014 0500   RBC 4.81 07/27/2021 1036   RBC 4.04 03/12/2014 0500   HGB 13.2 07/27/2021 1036   HCT 40.7 07/27/2021 1036   PLT 308 07/27/2021 1036   MCV 85 07/27/2021 1036   MCH 27.4 07/27/2021 1036   MCH 28.5 03/12/2014 0500   MCHC 32.4 07/27/2021 1036   MCHC 33.1 03/12/2014 0500   RDW 12.8 07/27/2021 1036   Iron Studies No results found for: IRON, TIBC, FERRITIN, IRONPCTSAT Lipid Panel     Component Value Date/Time   CHOL 152 01/22/2022 1213   TRIG 99 01/22/2022 1213   HDL 46 01/22/2022 1213   LDLCALC 88 01/22/2022 1213   Hepatic Function Panel     Component Value Date/Time   PROT 7.1 01/22/2022 1213   ALBUMIN 4.7 01/22/2022 1213   AST 15 01/22/2022 1213   ALT 20 01/22/2022 1213   ALKPHOS 53 01/22/2022 1213   BILITOT 0.5 01/22/2022 1213      Component Value Date/Time   TSH 0.756 07/27/2021 1036   Nutritional Lab Results  Component Value Date   VD25OH 62.5 01/22/2022   VD25OH 17.6 (L) 07/27/2021     ASSESSMENT AND PLAN  TREATMENT PLAN FOR OBESITY:  Recommended Dietary Goals  Reeanna has done well with weight loss and has met her goal weight.  I would encourage her to continue practicing portion control and smart choices, to eat more protein, increase vegetables, increase water  intake and add in resistance training.    Behavioral Intervention  We discussed the following Behavioral Modification Strategies today: increasing lean protein intake to established goals, decreasing simple carbohydrates , increasing vegetables, increasing fiber rich foods, increasing water  intake , work on meal planning and preparation, continue to practice mindfulness when eating, and planning for success.  Additional resources provided today: NA  Recommended Physical Activity Goals  Liann has been advised to work up to 150 minutes of moderate intensity aerobic activity a week and strengthening exercises 2-3 times per week for cardiovascular  health, weight loss maintenance and preservation of muscle mass.   She has agreed to Think about enjoyable ways to increase daily physical activity and overcoming barriers to exercise, Increase physical activity in their day and reduce sedentary time (increase NEAT)., Start strengthening exercises with a goal of 2-3 sessions a week , and continue to gradually increase the amount and intensity of exercise routine   ASSOCIATED CONDITIONS ADDRESSED TODAY  Action/Plan  Type 2 diabetes mellitus with other specified complication, unspecified whether long term insulin  use (HCC) Continue follow-up with endocrinology.  Take medications as directed.  Vitamin D  deficiency -     VITAMIN D  25 Hydroxy (Vit-D  Deficiency, Fractures) -     Ergocalciferol ; Take 1 capsule (50,000 Units total) by mouth once a week.  Dispense: 12 capsule; Refill: 0  Will adjust as needed based upon lab results.  Other fatigue -     Comprehensive metabolic panel with GFR -     CBC with Differential/Platelet -     Vitamin B12  Medication management -     Comprehensive metabolic panel with GFR  Dysuria -     Urinalysis  Other disorder of eating, with emotinal eating -     buPROPion  HCl ER (SR); Take 1 tablet (200 mg total) by mouth 2 (two) times daily.  Dispense: 180 tablet; Refill: 0  Generalized obesity  BMI 25.0-25.9,adult         Return in about 8 weeks (around 12/03/2023).SABRA She was informed of the importance of frequent follow up visits to maximize her success with intensive lifestyle modifications for her multiple health conditions.   ATTESTASTION STATEMENTS:  Reviewed by clinician on day of visit: allergies, medications, problem list, medical history, surgical history, family history, social history, and previous encounter notes.     Corean SAUNDERS. Khalifa Knecht FNP-C

## 2023-10-09 LAB — URINALYSIS
Bilirubin, UA: NEGATIVE
Glucose, UA: NEGATIVE
Ketones, UA: NEGATIVE
Nitrite, UA: NEGATIVE
Protein,UA: NEGATIVE
RBC, UA: NEGATIVE
Specific Gravity, UA: 1.011 (ref 1.005–1.030)
Urobilinogen, Ur: 0.2 mg/dL (ref 0.2–1.0)
pH, UA: 6.5 (ref 5.0–7.5)

## 2023-10-09 LAB — COMPREHENSIVE METABOLIC PANEL WITH GFR
ALT: 24 IU/L (ref 0–32)
AST: 24 IU/L (ref 0–40)
Albumin: 4.9 g/dL (ref 3.9–4.9)
Alkaline Phosphatase: 43 IU/L — ABNORMAL LOW (ref 44–121)
BUN/Creatinine Ratio: 13 (ref 9–23)
BUN: 10 mg/dL (ref 6–24)
Bilirubin Total: 0.4 mg/dL (ref 0.0–1.2)
CO2: 21 mmol/L (ref 20–29)
Calcium: 9.3 mg/dL (ref 8.7–10.2)
Chloride: 100 mmol/L (ref 96–106)
Creatinine, Ser: 0.75 mg/dL (ref 0.57–1.00)
Globulin, Total: 2.1 g/dL (ref 1.5–4.5)
Glucose: 70 mg/dL (ref 70–99)
Potassium: 4.9 mmol/L (ref 3.5–5.2)
Sodium: 137 mmol/L (ref 134–144)
Total Protein: 7 g/dL (ref 6.0–8.5)
eGFR: 98 mL/min/{1.73_m2} (ref >59)

## 2023-10-09 LAB — CBC WITH DIFFERENTIAL/PLATELET
Basophils Absolute: 0 10*3/uL (ref 0.0–0.2)
Basos: 1 %
EOS (ABSOLUTE): 0.1 10*3/uL (ref 0.0–0.4)
Eos: 2 %
Hematocrit: 42 % (ref 34.0–46.6)
Hemoglobin: 13.3 g/dL (ref 11.1–15.9)
Immature Grans (Abs): 0 10*3/uL (ref 0.0–0.1)
Immature Granulocytes: 0 %
Lymphocytes Absolute: 3.1 10*3/uL (ref 0.7–3.1)
Lymphs: 44 %
MCH: 28.4 pg (ref 26.6–33.0)
MCHC: 31.7 g/dL (ref 31.5–35.7)
MCV: 90 fL (ref 79–97)
Monocytes Absolute: 0.4 10*3/uL (ref 0.1–0.9)
Monocytes: 5 %
Neutrophils Absolute: 3.5 10*3/uL (ref 1.4–7.0)
Neutrophils: 48 %
Platelets: 320 10*3/uL (ref 150–450)
RBC: 4.69 x10E6/uL (ref 3.77–5.28)
RDW: 12.6 % (ref 11.7–15.4)
WBC: 7.2 10*3/uL (ref 3.4–10.8)

## 2023-10-09 LAB — VITAMIN D 25 HYDROXY (VIT D DEFICIENCY, FRACTURES): Vit D, 25-Hydroxy: 37.7 ng/mL (ref 30.0–100.0)

## 2023-10-09 LAB — VITAMIN B12: Vitamin B-12: 346 pg/mL (ref 232–1245)

## 2023-10-13 ENCOUNTER — Ambulatory Visit: Payer: Self-pay | Admitting: Nurse Practitioner

## 2023-10-14 ENCOUNTER — Other Ambulatory Visit: Payer: Self-pay | Admitting: Nurse Practitioner

## 2023-10-14 DIAGNOSIS — N390 Urinary tract infection, site not specified: Secondary | ICD-10-CM

## 2023-10-14 MED ORDER — SULFAMETHOXAZOLE-TRIMETHOPRIM 800-160 MG PO TABS: 1.0000 | ORAL_TABLET | Freq: Two times a day (BID) | ORAL | 0 refills | Status: AC

## 2023-12-08 ENCOUNTER — Ambulatory Visit: Admitting: Nurse Practitioner

## 2023-12-28 ENCOUNTER — Other Ambulatory Visit: Payer: Self-pay | Admitting: Nurse Practitioner

## 2023-12-28 DIAGNOSIS — E559 Vitamin D deficiency, unspecified: Secondary | ICD-10-CM

## 2024-01-03 ENCOUNTER — Other Ambulatory Visit: Payer: Self-pay | Admitting: Nurse Practitioner

## 2024-01-03 DIAGNOSIS — F5089 Other specified eating disorder: Secondary | ICD-10-CM

## 2024-01-20 ENCOUNTER — Ambulatory Visit: Admitting: Nurse Practitioner
